# Patient Record
Sex: Female | Born: 1960 | Race: Black or African American | Hispanic: No | Marital: Single | Smoking: Former smoker
Health system: Southern US, Community
[De-identification: ages and names within clinical notes are randomized; demographics above are authoritative.]

## PROBLEM LIST (undated history)

## (undated) DIAGNOSIS — I10 Essential (primary) hypertension: Secondary | ICD-10-CM

## (undated) HISTORY — PX: BREAST CYST ASPIRATION: SHX578

## (undated) HISTORY — PX: TOE SURGERY: SHX1073

## (undated) HISTORY — PX: ABDOMINAL HYSTERECTOMY: SHX81

## (undated) HISTORY — PX: SPINE SURGERY: SHX786

## (undated) HISTORY — PX: BREAST BIOPSY: SHX20

---

## 2005-04-02 ENCOUNTER — Ambulatory Visit: Payer: Self-pay

## 2005-05-03 ENCOUNTER — Emergency Department: Payer: Self-pay | Admitting: Emergency Medicine

## 2006-07-16 ENCOUNTER — Ambulatory Visit: Payer: Self-pay

## 2007-07-18 ENCOUNTER — Emergency Department: Payer: Self-pay | Admitting: Emergency Medicine

## 2007-07-22 ENCOUNTER — Ambulatory Visit: Payer: Self-pay

## 2008-07-20 ENCOUNTER — Ambulatory Visit: Payer: Self-pay | Admitting: Family Medicine

## 2009-08-16 ENCOUNTER — Ambulatory Visit: Payer: Self-pay | Admitting: Family Medicine

## 2010-11-28 ENCOUNTER — Ambulatory Visit: Payer: Self-pay | Admitting: Family Medicine

## 2012-03-03 ENCOUNTER — Ambulatory Visit: Payer: Self-pay

## 2012-03-05 ENCOUNTER — Ambulatory Visit: Payer: Self-pay

## 2012-03-05 IMAGING — MG MM ADDITIONAL VIEWS AT NO CHARGE
2 series · 5 of 5 positions shown · non-contrast
Comparison: none

REASON FOR EXAM: AV LT SPICULATED ASYMMETRY [REDACTED]
COMMENTS:

PROCEDURE:     MAM - MAM [REDACTED] ADDED VIEW DIG LEFT  - [DATE] [DATE]
RESULT:     TECHNIQUE: Digital diagnostic left mammograms were obtained. FDA
approved computer-aided detection (CAD) for mammography was utilized for
this study.

[L ML · left · 4 of 4 slices shown]
[im 1/4]
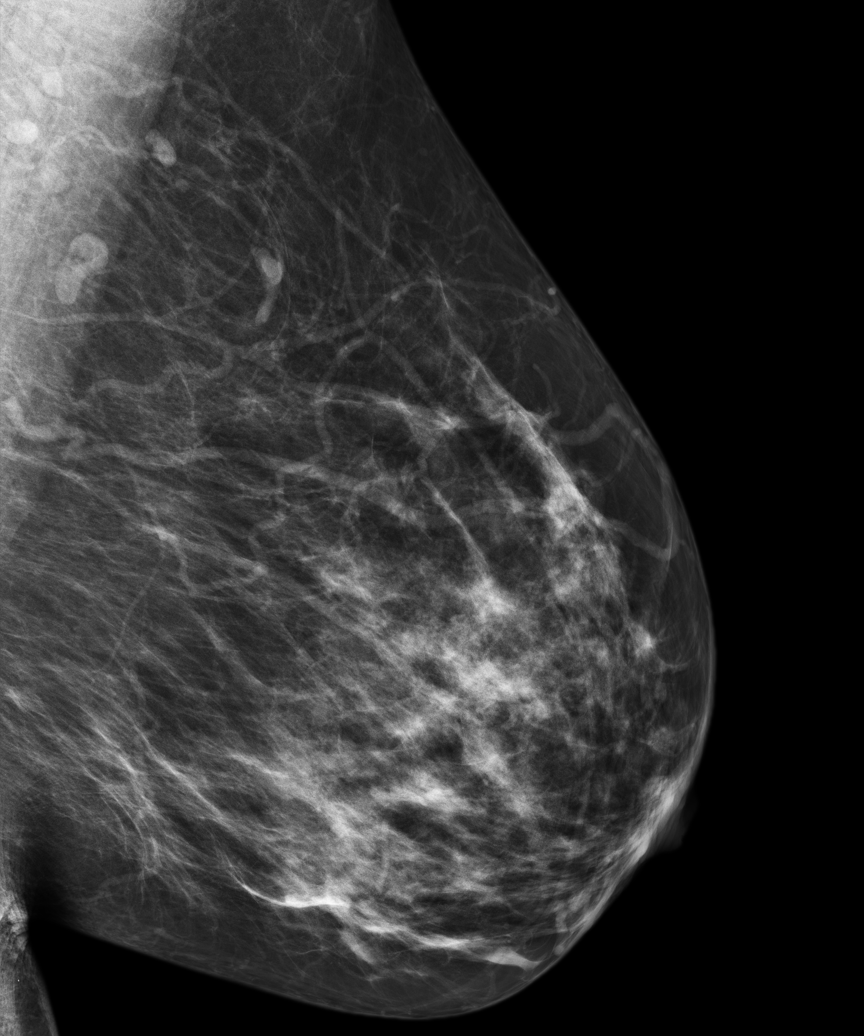
[im 2/4]
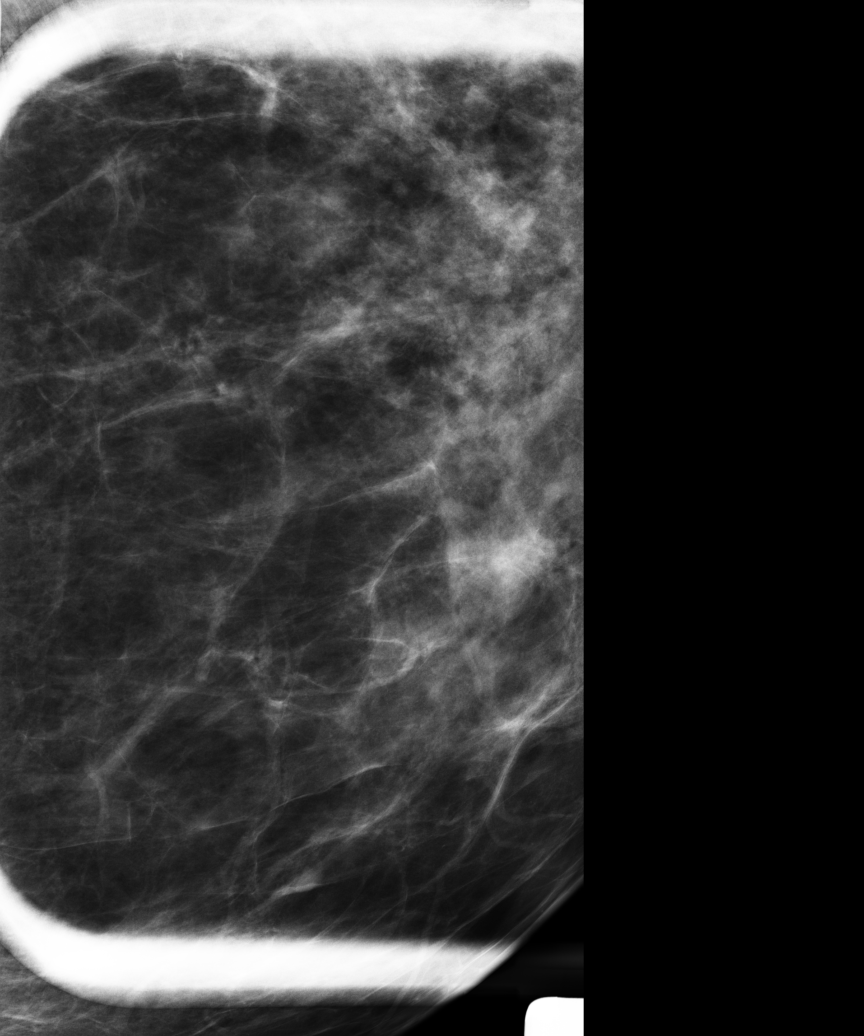
[im 3/4]
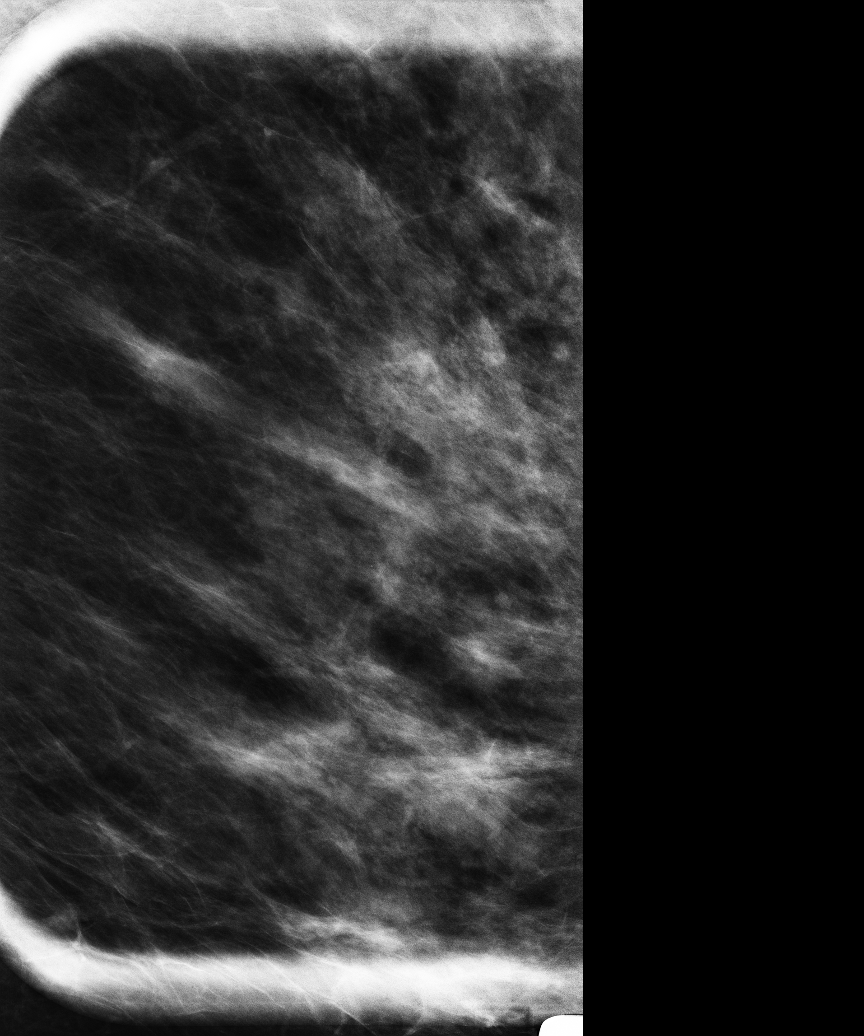
[im 4/4]
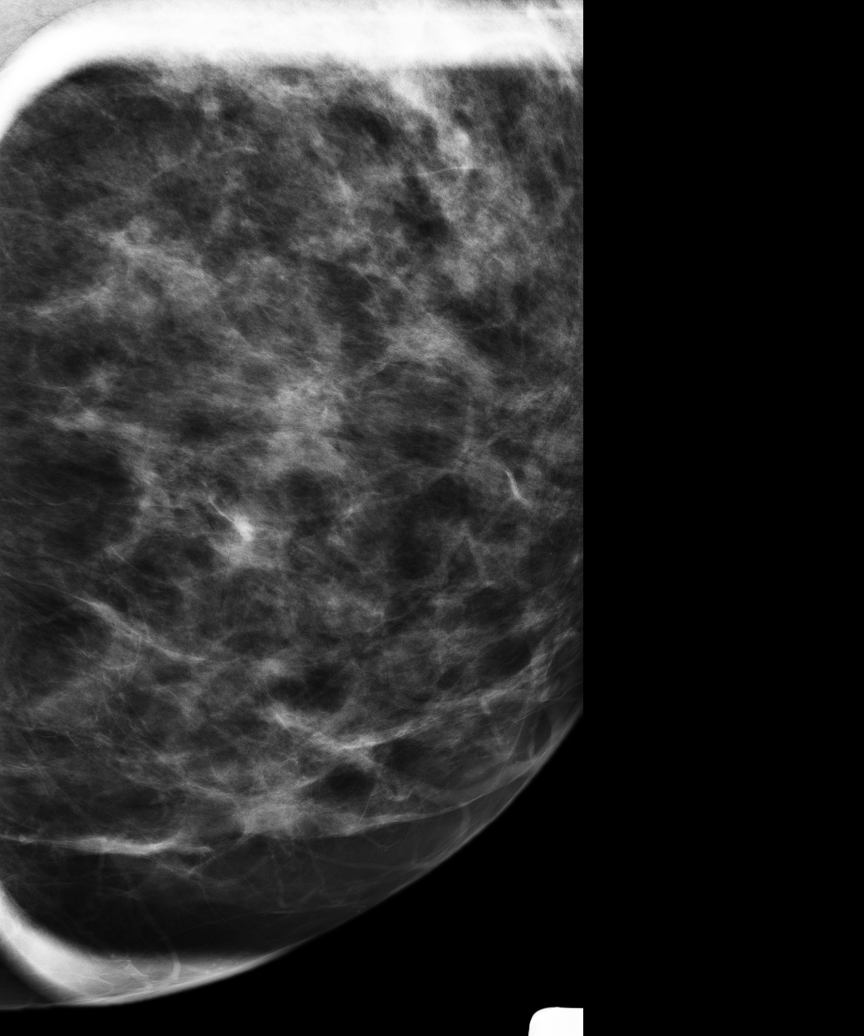

[L CC]
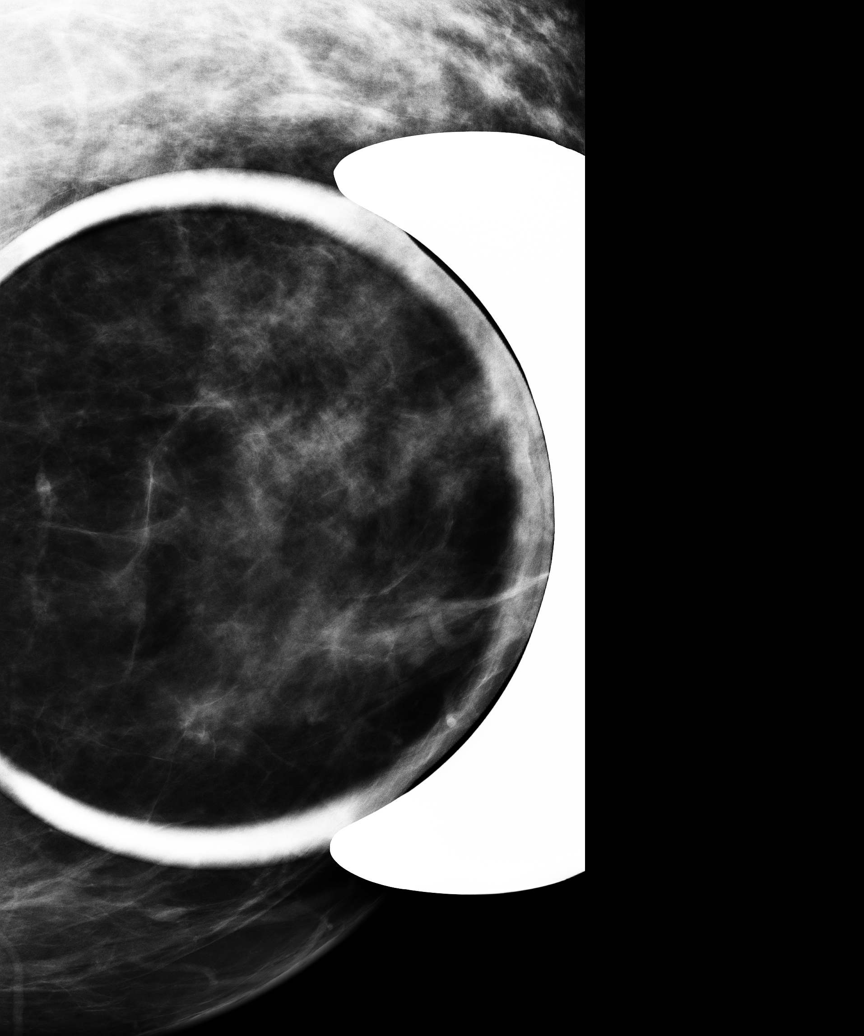

[5 of 5 positions shown; findings below may reference images not displayed]

FINDING: True lateral view and spot compression views of the left breast were
performed. The area of concern in the left medial breast compresses and
blends in with the adjacent fibroglandular tissue without a persistent mass
or architectural distortion. There are no suspicious microcalcifications.
IMPRESSION: 1.     Negative diagnostic left breast mammogram.
2.     Return to annual mammographic follow up recommended.
3.     BI-RADS:  Category 2- Benign.

A negative mammogram report does not preclude biopsy or other evaluation of
a clinically palpable or otherwise suspicious mass or lesion. Breast cancer
may not be detected by mammography in up to 10% of cases.

[REDACTED]

## 2012-11-17 ENCOUNTER — Emergency Department: Payer: Self-pay | Admitting: Emergency Medicine

## 2013-10-06 ENCOUNTER — Ambulatory Visit: Payer: Self-pay

## 2015-04-05 ENCOUNTER — Ambulatory Visit: Payer: Self-pay | Attending: Oncology

## 2015-04-05 ENCOUNTER — Ambulatory Visit
Admission: RE | Admit: 2015-04-05 | Discharge: 2015-04-05 | Disposition: A | Discharge status: Home / Self Care | Payer: Self-pay | Source: Ambulatory Visit | Attending: Oncology | Admitting: Oncology

## 2015-04-05 VITALS — BP 126/79 | HR 76 | Temp 98.1°F | Resp 18 | Ht 68.5 in | Wt 180.8 lb

## 2015-04-05 DIAGNOSIS — Z0000 Encounter for general adult medical examination without abnormal findings: Secondary | ICD-10-CM

## 2015-04-06 NOTE — Progress Notes (Signed)
Subjective:     Patient ID: Diane Wagner, female   DOB: 01-02-1961, 55 y.o.   MRN: [id number]  HPI   Review of Systems     Objective:   Physical Exam  Pulmonary/Chest: Right breast exhibits no inverted nipple, no mass, no nipple discharge, no skin change and no tenderness. Left breast exhibits no inverted nipple, no mass, no nipple discharge, no skin change and no tenderness. Breasts are symmetrical.       Assessment:     55 year old patient presents for [facility] clinic visit.  IPatient screened, and meets BCCCP eligibility.  Patient does not have insurance, Medicare or Medicaid.  Handout given on Affordable Care Act. Instructed patient on breast self-exam using teach back method.  CBE unremarkable. No mass or lump palpated.    Plan:     Sent for bilateral screening mammogram.

## 2015-04-06 NOTE — Progress Notes (Unsigned)
Letter mailed from [facility] to notify of normal mammogram results.  Patient to return in one year for annual screening.  Copy to [facility]. 

## 2017-10-06 ENCOUNTER — Other Ambulatory Visit: Payer: Self-pay | Admitting: Family Medicine

## 2017-10-06 DIAGNOSIS — Z1231 Encounter for screening mammogram for malignant neoplasm of breast: Secondary | ICD-10-CM

## 2017-10-21 ENCOUNTER — Ambulatory Visit
Admission: RE | Admit: 2017-10-21 | Discharge: 2017-10-21 | Disposition: A | Discharge status: Home / Self Care | Payer: Medicare Other | Source: Ambulatory Visit | Attending: Family Medicine | Admitting: Family Medicine

## 2017-10-21 DIAGNOSIS — Z1231 Encounter for screening mammogram for malignant neoplasm of breast: Secondary | ICD-10-CM | POA: Insufficient documentation

## 2017-10-23 ENCOUNTER — Other Ambulatory Visit: Payer: Self-pay | Admitting: Family Medicine

## 2017-10-23 DIAGNOSIS — R928 Other abnormal and inconclusive findings on diagnostic imaging of breast: Secondary | ICD-10-CM

## 2017-10-29 ENCOUNTER — Ambulatory Visit
Admission: RE | Admit: 2017-10-29 | Discharge: 2017-10-29 | Disposition: A | Discharge status: Home / Self Care | Payer: Medicare Other | Source: Ambulatory Visit | Attending: Family Medicine | Admitting: Family Medicine

## 2017-10-29 DIAGNOSIS — R928 Other abnormal and inconclusive findings on diagnostic imaging of breast: Secondary | ICD-10-CM | POA: Diagnosis not present

## 2018-11-25 ENCOUNTER — Other Ambulatory Visit: Payer: Self-pay | Admitting: Specialist

## 2018-11-25 DIAGNOSIS — Z1231 Encounter for screening mammogram for malignant neoplasm of breast: Secondary | ICD-10-CM

## 2018-12-10 ENCOUNTER — Encounter (INDEPENDENT_AMBULATORY_CARE_PROVIDER_SITE_OTHER): Payer: Self-pay

## 2018-12-10 ENCOUNTER — Ambulatory Visit
Admission: RE | Admit: 2018-12-10 | Discharge: 2018-12-10 | Disposition: A | Discharge status: Home / Self Care | Payer: Medicare Other | Source: Ambulatory Visit | Attending: Specialist | Admitting: Specialist

## 2018-12-10 ENCOUNTER — Other Ambulatory Visit: Payer: Self-pay

## 2018-12-10 DIAGNOSIS — Z1231 Encounter for screening mammogram for malignant neoplasm of breast: Secondary | ICD-10-CM | POA: Diagnosis present

## 2019-11-30 ENCOUNTER — Other Ambulatory Visit: Payer: Self-pay | Admitting: Specialist

## 2019-11-30 DIAGNOSIS — Z1231 Encounter for screening mammogram for malignant neoplasm of breast: Secondary | ICD-10-CM

## 2019-12-14 ENCOUNTER — Other Ambulatory Visit: Payer: Self-pay

## 2019-12-14 ENCOUNTER — Encounter (INDEPENDENT_AMBULATORY_CARE_PROVIDER_SITE_OTHER): Payer: Self-pay

## 2019-12-14 ENCOUNTER — Ambulatory Visit
Admission: RE | Admit: 2019-12-14 | Discharge: 2019-12-14 | Disposition: A | Discharge status: Home / Self Care | Payer: Medicare HMO | Source: Ambulatory Visit | Attending: Specialist | Admitting: Specialist

## 2019-12-14 DIAGNOSIS — Z1231 Encounter for screening mammogram for malignant neoplasm of breast: Secondary | ICD-10-CM | POA: Diagnosis present

## 2020-02-22 ENCOUNTER — Other Ambulatory Visit: Payer: Self-pay | Admitting: Physician Assistant

## 2020-02-22 DIAGNOSIS — J324 Chronic pansinusitis: Secondary | ICD-10-CM

## 2020-03-21 ENCOUNTER — Ambulatory Visit
Admission: RE | Admit: 2020-03-21 | Discharge: 2020-03-21 | Disposition: A | Discharge status: Home / Self Care | Payer: Medicare HMO | Source: Ambulatory Visit | Attending: Physician Assistant | Admitting: Physician Assistant

## 2020-03-21 ENCOUNTER — Other Ambulatory Visit: Payer: Self-pay

## 2020-03-21 DIAGNOSIS — J324 Chronic pansinusitis: Secondary | ICD-10-CM

## 2020-03-25 ENCOUNTER — Encounter: Payer: Self-pay | Admitting: Emergency Medicine

## 2020-03-25 ENCOUNTER — Other Ambulatory Visit: Payer: Self-pay

## 2020-03-25 ENCOUNTER — Ambulatory Visit
Admission: EM | Admit: 2020-03-25 | Discharge: 2020-03-25 | Disposition: A | Discharge status: Home / Self Care | Payer: Medicare HMO | Attending: Internal Medicine | Admitting: Internal Medicine

## 2020-03-25 DIAGNOSIS — U071 COVID-19: Secondary | ICD-10-CM | POA: Insufficient documentation

## 2020-03-25 DIAGNOSIS — R519 Headache, unspecified: Secondary | ICD-10-CM | POA: Diagnosis not present

## 2020-03-25 DIAGNOSIS — R0981 Nasal congestion: Secondary | ICD-10-CM | POA: Diagnosis not present

## 2020-03-25 DIAGNOSIS — J069 Acute upper respiratory infection, unspecified: Secondary | ICD-10-CM

## 2020-03-25 MED ORDER — BENZONATATE 100 MG PO CAPS: 100.0000 mg | ORAL_CAPSULE | Freq: Three times a day (TID) | ORAL | 0 refills | Status: DC

## 2020-03-25 MED ORDER — PREDNISONE 50 MG PO TABS: ORAL_TABLET | ORAL | 0 refills | Status: DC

## 2020-03-25 MED ORDER — FLUTICASONE PROPIONATE 50 MCG/ACT NA SUSP: 2.0000 | Freq: Every day | NASAL | 0 refills | Status: DC

## 2020-03-25 NOTE — ED Provider Notes (Signed)
[facility]    CSN: [id number] Arrival date & time: 03/25/20  6789      History   Chief Complaint Chief Complaint  Patient presents with  . Sinus Problem  . Cough    HPI Diane Wagner is a 60 y.o. female who presents with onset of stuffy nose, cough, HA, nose congestion and sinus pressure x 4 days ago. She denies fevers. Her nose is very stuffy. Has not done any nasal sprays or saline rinses. Has not had sinuses surgeries  History reviewed. No pertinent past medical history.  There are no problems to display for this patient.   Past Surgical History:  Procedure Laterality Date  . ABDOMINAL HYSTERECTOMY    . BREAST BIOPSY Right    negative 1985  . BREAST CYST ASPIRATION Right    neg    OB History   No obstetric history on file.    Home Medications    Prior to Admission medications   Medication Sig Start Date End Date Taking? Authorizing Provider  amLODipine (NORVASC) 10 MG tablet Take by mouth. 12/28/19 12/27/20 Yes [provider]  benazepril (LOTENSIN) 40 MG tablet Take by mouth. 12/28/19 12/27/20 Yes [provider]  benzonatate (TESSALON) 100 MG capsule Take 1 capsule (100 mg total) by mouth every 8 (eight) hours. 03/25/20  Yes Rodriguez-Southworth, Nettie Elm, PA-C  bimatoprost (LUMIGAN) 0.01 % SOLN Lumigan 0.01 % eye drops 08/17/19  Yes [provider]  estradiol (ESTRACE) 0.1 MG/GM vaginal cream estradiol 0.01% (0.1 mg/gram) vaginal cream 04/09/19  Yes [provider]  fluticasone (FLONASE) 50 MCG/ACT nasal spray Place 2 sprays into both nostrils daily. 03/25/20  Yes Rodriguez-Southworth, Nettie Elm, PA-C  hydrochlorothiazide (HYDRODIURIL) 25 MG tablet hydrochlorothiazide 25 mg tablet 12/28/19  Yes [provider]  meloxicam (MOBIC) 15 MG tablet meloxicam 15 mg tablet  TAKE 1 TABLET BY MOUTH ONCE DAILY FOR MAXIMUM OF 7 DAYS AS NEEDED 12/11/19  Yes [provider]  predniSONE (DELTASONE) 50 MG tablet One qd for  sinus inflammation 03/25/20  Yes Rodriguez-Southworth, Nettie Elm, PA-C  traMADol (ULTRAM) 50 MG tablet tramadol 50 mg tablet  Take 1 tablet every 6 hours by oral route.    [provider]    Family History Family History  Problem Relation Age of Onset  . Lung cancer Sister 50  . Breast cancer Neg Hx     Social History Social History   Tobacco Use  . Smoking status: Former Smoker    Packs/day: 0.50    Years: 40.00    Pack years: 20.00    Types: Cigarettes    Quit date: 03/19/2014    Years since quitting: 6.0  . Smokeless tobacco: Never Used  Vaping Use  . Vaping Use: Never used  Substance Use Topics  . Alcohol use: Yes    Alcohol/week: 0.0 standard drinks    Comment: Socially  . Drug use: No     Allergies   Patient has no known allergies.   Review of Systems Review of Systems  Constitutional: Positive for fatigue. Negative for appetite change, chills, diaphoresis and fever.  HENT: Positive for congestion, postnasal drip, sinus pressure and sinus pain. Negative for ear discharge, ear pain and sore throat.   Eyes: Negative for discharge.  Respiratory: Positive for cough. Negative for chest tightness and shortness of breath.   Cardiovascular: Negative for chest pain.  Gastrointestinal: Negative for diarrhea, nausea and vomiting.  Musculoskeletal: Negative for joint swelling and myalgias.  Skin: Negative for rash.  Neurological:  Positive for headaches.  Hematological: Negative for adenopathy.   Physical Exam Triage Vital Signs ED Triage Vitals  Enc Vitals Group     BP 03/25/20 0945 (!) 143/93     Pulse Rate 03/25/20 0945 99     Resp 03/25/20 0945 14     Temp 03/25/20 0945 98.3 F (36.8 C)     Temp Source 03/25/20 0945 Oral     SpO2 03/25/20 0945 100 %     Weight 03/25/20 0941 165 lb (74.8 kg)     Height 03/25/20 0941 5\' 7"  (1.702 m)     Head Circumference --      Peak Flow --      Pain Score 03/25/20 0941 7     Pain Loc --      Pain Edu? --      Excl.  in GC? --    No data found.  Updated Vital Signs BP (!) 143/93 (BP Location: Left Arm)   Pulse 99   Temp 98.3 F (36.8 C) (Oral)   Resp 14   Ht 5\' 7"  (1.702 m)   Wt 165 lb (74.8 kg)   SpO2 100%   BMI 25.84 kg/m   Visual Acuity Right Eye Distance:   Left Eye Distance:   Bilateral Distance:    Right Eye Near:   Left Eye Near:    Bilateral Near:     Physical Exam Physical Exam Vitals signs and nursing note reviewed.  Constitutional:      General: She is not in acute distress.    Appearance: Normal appearance. She is not ill-appearing, toxic-appearing or diaphoretic.  HENT:     Head: Normocephalic.     Right Ear: Tympanic membrane, ear canal and external ear normal.     Left Ear: Tympanic membrane, ear canal and external ear normal.     Nose: severe mucosa swelling noted with clear mucous    Mouth/Throat:     Mouth: Mucous membranes are moist.  Eyes:     General: No scleral icterus.       Right eye: No discharge.        Left eye: No discharge.     Conjunctiva/sclera: Conjunctivae normal.  Neck:     Musculoskeletal: Neck supple. No neck rigidity.  Cardiovascular:     Rate and Rhythm: Normal rate and regular rhythm.     Heart sounds: No murmur.  Pulmonary:     Effort: Pulmonary effort is normal.     Breath sounds: Normal breath sounds.  Abdominal:     General: Bowel sounds are normal. There is no distension.     Palpations: Abdomen is soft. There is no mass.     Tenderness: There is no abdominal tenderness. There is no guarding or rebound.     Hernia: No hernia is present.  Musculoskeletal: Normal range of motion.  Lymphadenopathy:     Cervical: No cervical adenopathy.  Skin:    General: Skin is warm and dry.     Coloration: Skin is not jaundiced.     Findings: No rash.  Neurological:     Mental Status: She is alert and oriented to person, place, and time.     Gait: Gait normal.  Psychiatric:        Mood and Affect: Mood normal.        Behavior: Behavior  normal.        Thought Content: Thought content normal.        Judgment: Judgment normal.  UC Treatments / Results  Labs (all labs ordered are listed, but only abnormal results are displayed) Labs Reviewed  SARS CORONAVIRUS 2 (TAT 6-24 HRS)    EKG   Radiology No results found.  Procedures Procedures (including critical care time)  Medications Ordered in UC Medications - No data to display  Initial Impression / Assessment and Plan / UC Course  I have reviewed the triage vital signs and the nursing notes. Viral illness with sinus HA. I showed her a video on how to do saline nose rinses and she is willing to try this. I also placed her on Prednisone and Flonase.  Covid test is pending. See instructions.    Final Clinical Impressions(s) / UC Diagnoses   Final diagnoses:  Upper respiratory tract infection, unspecified type  Nose congestion  Sinus headache     Discharge Instructions     You do not have a bacterial sinus infection, but you have a viral illness causing severe sinus inflammation and the prednisone and flonase will help with the congestion. Also try the saline rinses twice a day ones you can pass air through your nostrils, about twice a day for 5-7 days. Then as needed if you start getting congested or feel you are getting a cold  Come back if you develop a fever of 100.5 or more     ED Prescriptions    Medication Sig Dispense Auth. Provider   predniSONE (DELTASONE) 50 MG tablet One qd for sinus inflammation 5 tablet Rodriguez-Southworth, Vlada Uriostegui, PA-C   fluticasone (FLONASE) 50 MCG/ACT nasal spray Place 2 sprays into both nostrils daily. 16 g Rodriguez-Southworth, Carmisha Larusso, PA-C   benzonatate (TESSALON) 100 MG capsule Take 1 capsule (100 mg total) by mouth every 8 (eight) hours. 21 capsule Rodriguez-Southworth, Nettie Elm, PA-C     PDMP not reviewed this encounter.   Garey Ham, PA-C 03/25/20 1750

## 2020-03-25 NOTE — ED Triage Notes (Signed)
Patient c/o cough, stuffy nose, HAs, and sinus congestion and pressure that started Tuesday.  Patient denies fevers.

## 2020-03-25 NOTE — Discharge Instructions (Addendum)
You do not have a bacterial sinus infection, but you have a viral illness causing severe sinus inflammation and the prednisone and flonase will help with the congestion. Also try the saline rinses twice a day ones you can pass air through your nostrils, about twice a day for 5-7 days. Then as needed if you start getting congested or feel you are getting a cold  Come back if you develop a fever of 100.5 or more

## 2020-03-26 LAB — SARS CORONAVIRUS 2 (TAT 6-24 HRS): SARS Coronavirus 2: POSITIVE — AB

## 2020-09-14 ENCOUNTER — Other Ambulatory Visit: Payer: Self-pay

## 2020-09-14 ENCOUNTER — Encounter: Payer: Self-pay | Admitting: Emergency Medicine

## 2020-09-14 ENCOUNTER — Ambulatory Visit
Admission: EM | Admit: 2020-09-14 | Discharge: 2020-09-14 | Disposition: A | Discharge status: Home / Self Care | Payer: Medicare HMO | Attending: Sports Medicine | Admitting: Sports Medicine

## 2020-09-14 DIAGNOSIS — R059 Cough, unspecified: Secondary | ICD-10-CM | POA: Diagnosis not present

## 2020-09-14 DIAGNOSIS — Z79899 Other long term (current) drug therapy: Secondary | ICD-10-CM | POA: Insufficient documentation

## 2020-09-14 DIAGNOSIS — Z87891 Personal history of nicotine dependence: Secondary | ICD-10-CM | POA: Diagnosis not present

## 2020-09-14 DIAGNOSIS — J069 Acute upper respiratory infection, unspecified: Secondary | ICD-10-CM

## 2020-09-14 DIAGNOSIS — Z791 Long term (current) use of non-steroidal anti-inflammatories (NSAID): Secondary | ICD-10-CM | POA: Diagnosis not present

## 2020-09-14 DIAGNOSIS — R0981 Nasal congestion: Secondary | ICD-10-CM | POA: Diagnosis not present

## 2020-09-14 DIAGNOSIS — Z20822 Contact with and (suspected) exposure to covid-19: Secondary | ICD-10-CM | POA: Diagnosis not present

## 2020-09-14 DIAGNOSIS — Z801 Family history of malignant neoplasm of trachea, bronchus and lung: Secondary | ICD-10-CM | POA: Diagnosis not present

## 2020-09-14 MED ORDER — PROMETHAZINE-DM 6.25-15 MG/5ML PO SYRP: 5.0000 mL | ORAL_SOLUTION | Freq: Four times a day (QID) | ORAL | 0 refills | Status: DC | PRN

## 2020-09-14 NOTE — Discharge Instructions (Addendum)

## 2020-09-14 NOTE — ED Triage Notes (Signed)
Pt c/o cough, nasal congestion, and headache. Started about 4 days ago. Denies fever.

## 2020-09-14 NOTE — ED Provider Notes (Signed)
[facility]    CSN: [id number] Arrival date & time: 09/14/20  1348      History   Chief Complaint Chief Complaint  Patient presents with   Cough   Nasal Congestion    HPI Diane Wagner is a 60 y.o. female presenting for approximately 4-day history of fatigue, headaches, nasal congestion, dry cough.  Patient says she initially had a sore throat but that is resolved.  She denies any fevers, body aches, chest discomfort, breathing difficulty, nausea/vomiting or diarrhea.  Patient says that she has bronchitis about twice a year but has never been diagnosed with COPD.  She believes she has bronchitis again.  Patient denies any sick contacts and no known exposure to COVID-19.  Personal history of COVID-19 in January of this year.  Patient vaccinated for COVID-19 x2 and boosted in April 2022.  Patient has been using over-the-counter Flonase, decongestant/cough medication and albuterol as needed.  Patient says she does not feel sick like she did in January when she had COVID 19.  Patient recently coming off Medrol Dosepak for a back condition yesterday.  Patient says that she typically improves with azithromycin or prednisone.  No other concerns.  HPI  History reviewed. No pertinent past medical history.  There are no problems to display for this patient.   Past Surgical History:  Procedure Laterality Date   ABDOMINAL HYSTERECTOMY     BREAST BIOPSY Right    negative 1985   BREAST CYST ASPIRATION Right    neg    OB History   No obstetric history on file.      Home Medications    Prior to Admission medications   Medication Sig Start Date End Date Taking? Authorizing Provider  amLODipine (NORVASC) 10 MG tablet Take by mouth. 12/28/19 12/27/20 Yes [provider]  benazepril (LOTENSIN) 40 MG tablet Take by mouth. 12/28/19 12/27/20 Yes [provider]  bimatoprost (LUMIGAN) 0.01 % SOLN Lumigan 0.01 % eye drops 08/17/19  Yes [provider]   hydrochlorothiazide (HYDRODIURIL) 25 MG tablet hydrochlorothiazide 25 mg tablet 12/28/19  Yes [provider]  meloxicam (MOBIC) 15 MG tablet meloxicam 15 mg tablet  TAKE 1 TABLET BY MOUTH ONCE DAILY FOR MAXIMUM OF 7 DAYS AS NEEDED 12/11/19  Yes [provider]  promethazine-dextromethorphan (PROMETHAZINE-DM) 6.25-15 MG/5ML syrup Take 5 mLs by mouth 4 (four) times daily as needed for cough. 09/14/20  Yes Eusebio Friendly B, PA-C  traMADol (ULTRAM) 50 MG tablet tramadol 50 mg tablet  Take 1 tablet every 6 hours by oral route.   Yes [provider]  estradiol (ESTRACE) 0.1 MG/GM vaginal cream estradiol 0.01% (0.1 mg/gram) vaginal cream 04/09/19   [provider]  fluticasone (FLONASE) 50 MCG/ACT nasal spray Place 2 sprays into both nostrils daily. 03/25/20   Rodriguez-Southworth, Nettie Elm, PA-C    Family History Family History  Problem Relation Age of Onset   Lung cancer Sister 49   Breast cancer Neg Hx     Social History Social History   Tobacco Use   Smoking status: Former    Packs/day: 0.50    Years: 40.00    Pack years: 20.00    Types: Cigarettes    Quit date: 03/19/2014    Years since quitting: 6.4   Smokeless tobacco: Never  Vaping Use   Vaping Use: Never used  Substance Use Topics   Alcohol use: Yes    Alcohol/week: 0.0 standard drinks    Comment: Socially   Drug use: No  Allergies   Patient has no known allergies.   Review of Systems Review of Systems  Constitutional:  Positive for fatigue. Negative for chills, diaphoresis and fever.  HENT:  Positive for congestion and rhinorrhea. Negative for ear pain, sinus pressure, sinus pain and sore throat.   Respiratory:  Positive for cough. Negative for shortness of breath.   Cardiovascular:  Negative for chest pain.  Gastrointestinal:  Negative for abdominal pain, nausea and vomiting.  Musculoskeletal:  Negative for arthralgias and myalgias.  Skin:  Negative for rash.  Neurological:   Positive for headaches. Negative for weakness.  Hematological:  Negative for adenopathy.    Physical Exam Triage Vital Signs ED Triage Vitals  Enc Vitals Group     BP 09/14/20 1405 (!) 147/72     Pulse Rate 09/14/20 1405 (!) 104     Resp 09/14/20 1405 18     Temp 09/14/20 1405 98.4 F (36.9 C)     Temp Source 09/14/20 1405 Oral     SpO2 09/14/20 1405 100 %     Weight 09/14/20 1406 164 lb 14.5 oz (74.8 kg)     Height 09/14/20 1406 5\' 7"  (1.702 m)     Head Circumference --      Peak Flow --      Pain Score 09/14/20 1405 5     Pain Loc --      Pain Edu? --      Excl. in GC? --    No data found.  Updated Vital Signs BP (!) 147/72 (BP Location: Left Arm)   Pulse (!) 104   Temp 98.4 F (36.9 C) (Oral)   Resp 18   Ht 5\' 7"  (1.702 m)   Wt 164 lb 14.5 oz (74.8 kg)   SpO2 100%   BMI 25.83 kg/m       Physical Exam Vitals and nursing note reviewed.  Constitutional:      General: She is not in acute distress.    Appearance: Normal appearance. She is not ill-appearing or toxic-appearing.  HENT:     Head: Normocephalic and atraumatic.     Right Ear: Tympanic membrane, ear canal and external ear normal.     Left Ear: Tympanic membrane, ear canal and external ear normal.     Nose: Mucosal edema, congestion and rhinorrhea present.     Mouth/Throat:     Mouth: Mucous membranes are moist.     Pharynx: Oropharynx is clear. No posterior oropharyngeal erythema.  Eyes:     General: No scleral icterus.       Right eye: No discharge.        Left eye: No discharge.     Conjunctiva/sclera: Conjunctivae normal.  Cardiovascular:     Rate and Rhythm: Regular rhythm. Tachycardia present.     Heart sounds: Normal heart sounds.  Pulmonary:     Effort: Pulmonary effort is normal. No respiratory distress.     Breath sounds: Normal breath sounds. No wheezing, rhonchi or rales.  Musculoskeletal:     Cervical back: Neck supple.  Skin:    General: Skin is dry.  Neurological:     General:  No focal deficit present.     Mental Status: She is alert. Mental status is at baseline.     Motor: No weakness.     Gait: Gait normal.  Psychiatric:        Mood and Affect: Mood normal.        Behavior: Behavior normal.  Thought Content: Thought content normal.     UC Treatments / Results  Labs (all labs ordered are listed, but only abnormal results are displayed) Labs Reviewed  SARS CORONAVIRUS 2 (TAT 6-24 HRS)    EKG   Radiology No results found.  Procedures Procedures (including critical care time)  Medications Ordered in UC Medications - No data to display  Initial Impression / Assessment and Plan / UC Course  I have reviewed the triage vital signs and the nursing notes.  Pertinent labs & imaging results that were available during my care of the patient were reviewed by me and considered in my medical decision making (see chart for details).  60 year old female presenting for 4-day history of fatigue, headaches, nasal congestion and cough.  Vitals are stable.  She is afebrile and overall well-appearing.  Exam significant for congestion on exam with out rhinorrhea.  Her chest is clear to auscultation heart regular rate and rhythm.  PCR COVID test obtained.  Current CDC guidelines, isolation protocol and ED precautions reviewed with patient.  Do not see any previous diagnoses of COPD or chronic bronchitis in her chart.  She is a former smoker.  Not concerned about bronchitis or COPD or pneumonia based on her exam today.  Reviewed that with her.  Advised her this is likely viral URI and supportive care encouraged with increasing rest and fluids.  Sent in Promethazine DM and advised her to continue with her nasal spray.  She denies any shortness of breath but says she does occasionally use the inhaler.  Advised her to follow-up here if she develops a fever, worsening cough or breathing issues or if she is not better after another 7 to 10 days.  Patient agrees.   Final  Clinical Impressions(s) / UC Diagnoses   Final diagnoses:  Viral upper respiratory tract infection  Cough  Nasal congestion     Discharge Instructions      URI/COLD SYMPTOMS: Your exam today is consistent with a viral illness. Antibiotics are not indicated at this time. Use medications as directed, including cough syrup, nasal saline, and decongestants. Your symptoms should improve over the next few days and resolve within 7-10 days. Increase rest and fluids. F/u if symptoms worsen or predominate such as sore throat, ear pain, productive cough, shortness of breath, or if you develop high fevers or worsening fatigue over the next several days.    You have received COVID testing today either for positive exposure, concerning symptoms that could be related to COVID infection, screening purposes, or re-testing after confirmed positive.  Your test obtained today checks for active viral infection in the last 1-2 weeks. If your test is negative now, you can still test positive later. So, if you do develop symptoms you should either get re-tested and/or isolate x 5 days and then strict mask use x 5 days (unvaccinated) or mask use x 10 days (vaccinated). Please follow CDC guidelines.  While Rapid antigen tests come back in 15-20 minutes, send out PCR/molecular test results typically come back within 1-3 days. In the mean time, if you are symptomatic, assume this could be a positive test and treat/monitor yourself as if you do have COVID.   We will call with test results if positive. Please download the MyChart app and set up a profile to access test results.   If symptomatic, go home and rest. Push fluids. Take Tylenol as needed for discomfort. Gargle warm salt water. Throat lozenges. Take Mucinex DM or Robitussin for cough. Humidifier  in bedroom to ease coughing. Warm showers. Also review the COVID handout for more information.  COVID-19 INFECTION: The incubation period of COVID-19 is approximately 14  days after exposure, with most symptoms developing in roughly 4-5 days. Symptoms may range in severity from mild to critically severe. Roughly 80% of those infected will have mild symptoms. People of any age may become infected with COVID-19 and have the ability to transmit the virus. The most common symptoms include: fever, fatigue, cough, body aches, headaches, sore throat, nasal congestion, shortness of breath, nausea, vomiting, diarrhea, changes in smell and/or taste.    COURSE OF ILLNESS Some patients may begin with mild disease which can progress quickly into critical symptoms. If your symptoms are worsening please call ahead to the Emergency Department and proceed there for further treatment. Recovery time appears to be roughly 1-2 weeks for mild symptoms and 3-6 weeks for severe disease.   GO IMMEDIATELY TO ER FOR FEVER YOU ARE UNABLE TO GET DOWN WITH TYLENOL, BREATHING PROBLEMS, CHEST PAIN, FATIGUE, LETHARGY, INABILITY TO EAT OR DRINK, ETC  QUARANTINE AND ISOLATION: To help decrease the spread of COVID-19 please remain isolated if you have COVID infection or are highly suspected to have COVID infection. This means -stay home and isolate to one room in the home if you live with others. Do not share a bed or bathroom with others while ill, sanitize and wipe down all countertops and keep common areas clean and disinfected. Stay home for 5 days. If you have no symptoms or your symptoms are resolving after 5 days, you can leave your house. Continue to wear a mask around others for 5 additional days. If you have been in close contact (within 6 feet) of someone diagnosed with COVID 19, you are advised to quarantine in your home for 14 days as symptoms can develop anywhere from 2-14 days after exposure to the virus. If you develop symptoms, you  must isolate.  Most current guidelines for COVID after exposure -unvaccinated: isolate 5 days and strict mask use x 5 days. Test on day 5 is  possible -vaccinated: wear mask x 10 days if symptoms do not develop -You do not necessarily need to be tested for COVID if you have + exposure and  develop symptoms. Just isolate at home x10 days from symptom onset During this global pandemic, CDC advises to practice social distancing, try to stay at least 35ft away from others at all times. Wear a face covering. Wash and sanitize your hands regularly and avoid going anywhere that is not necessary.  KEEP IN MIND THAT THE COVID TEST IS NOT 100% ACCURATE AND YOU SHOULD STILL DO EVERYTHING TO PREVENT POTENTIAL SPREAD OF VIRUS TO OTHERS (WEAR MASK, WEAR GLOVES, WASH HANDS AND SANITIZE REGULARLY). IF INITIAL TEST IS NEGATIVE, THIS MAY NOT MEAN YOU ARE DEFINITELY NEGATIVE. MOST ACCURATE TESTING IS DONE 5-7 DAYS AFTER EXPOSURE.   It is not advised by CDC to get re-tested after receiving a positive COVID test since you can still test positive for weeks to months after you have already cleared the virus.   *If you have not been vaccinated for COVID, I strongly suggest you consider getting vaccinated as long as there are no contraindications.       ED Prescriptions     Medication Sig Dispense Auth. Provider   promethazine-dextromethorphan (PROMETHAZINE-DM) 6.25-15 MG/5ML syrup Take 5 mLs by mouth 4 (four) times daily as needed for cough. 118 mL Shirlee Latch, PA-C  PDMP not reviewed this encounter.   Shirlee Latch, PA-C 09/14/20 1436

## 2020-09-15 LAB — SARS CORONAVIRUS 2 (TAT 6-24 HRS): SARS Coronavirus 2: NEGATIVE

## 2020-09-20 ENCOUNTER — Other Ambulatory Visit (HOSPITAL_COMMUNITY): Payer: Self-pay | Admitting: Otolaryngology

## 2020-09-20 ENCOUNTER — Other Ambulatory Visit: Payer: Self-pay | Admitting: Otolaryngology

## 2020-09-20 DIAGNOSIS — J324 Chronic pansinusitis: Secondary | ICD-10-CM

## 2020-10-02 ENCOUNTER — Ambulatory Visit
Admission: RE | Admit: 2020-10-02 | Discharge: 2020-10-02 | Disposition: A | Discharge status: Home / Self Care | Payer: Medicare HMO | Source: Ambulatory Visit | Attending: Otolaryngology | Admitting: Otolaryngology

## 2020-10-02 ENCOUNTER — Other Ambulatory Visit: Payer: Self-pay

## 2020-10-02 DIAGNOSIS — J324 Chronic pansinusitis: Secondary | ICD-10-CM | POA: Diagnosis present

## 2020-10-09 ENCOUNTER — Other Ambulatory Visit: Payer: Self-pay | Admitting: Specialist

## 2020-10-09 DIAGNOSIS — Z1231 Encounter for screening mammogram for malignant neoplasm of breast: Secondary | ICD-10-CM

## 2020-12-14 ENCOUNTER — Other Ambulatory Visit: Payer: Self-pay

## 2020-12-14 ENCOUNTER — Ambulatory Visit
Admission: RE | Admit: 2020-12-14 | Discharge: 2020-12-14 | Disposition: A | Discharge status: Home / Self Care | Payer: Medicare HMO | Source: Ambulatory Visit | Attending: Specialist | Admitting: Specialist

## 2020-12-14 DIAGNOSIS — Z1231 Encounter for screening mammogram for malignant neoplasm of breast: Secondary | ICD-10-CM | POA: Insufficient documentation

## 2021-04-01 ENCOUNTER — Other Ambulatory Visit: Payer: Self-pay

## 2021-04-01 ENCOUNTER — Emergency Department: Payer: Medicare HMO

## 2021-04-01 ENCOUNTER — Encounter: Payer: Self-pay | Admitting: Emergency Medicine

## 2021-04-01 DIAGNOSIS — I10 Essential (primary) hypertension: Secondary | ICD-10-CM | POA: Insufficient documentation

## 2021-04-01 DIAGNOSIS — K5790 Diverticulosis of intestine, part unspecified, without perforation or abscess without bleeding: Secondary | ICD-10-CM | POA: Insufficient documentation

## 2021-04-01 DIAGNOSIS — R1010 Upper abdominal pain, unspecified: Secondary | ICD-10-CM | POA: Diagnosis present

## 2021-04-01 DIAGNOSIS — Z79899 Other long term (current) drug therapy: Secondary | ICD-10-CM | POA: Diagnosis not present

## 2021-04-01 DIAGNOSIS — R9431 Abnormal electrocardiogram [ECG] [EKG]: Secondary | ICD-10-CM | POA: Insufficient documentation

## 2021-04-01 DIAGNOSIS — N281 Cyst of kidney, acquired: Secondary | ICD-10-CM | POA: Insufficient documentation

## 2021-04-01 LAB — URINALYSIS, COMPLETE (UACMP) WITH MICROSCOPIC
Bilirubin Urine: NEGATIVE
Glucose, UA: NEGATIVE mg/dL
Hgb urine dipstick: NEGATIVE
Ketones, ur: NEGATIVE mg/dL
Leukocytes,Ua: NEGATIVE
Nitrite: NEGATIVE
Protein, ur: NEGATIVE mg/dL
Specific Gravity, Urine: 1.02 (ref 1.005–1.030)
pH: 6.5 (ref 5.0–8.0)

## 2021-04-01 LAB — CBC WITH DIFFERENTIAL/PLATELET
Abs Immature Granulocytes: 0.02 10*3/uL (ref 0.00–0.07)
Basophils Absolute: 0 10*3/uL (ref 0.0–0.1)
Basophils Relative: 1 %
Eosinophils Absolute: 0.1 10*3/uL (ref 0.0–0.5)
Eosinophils Relative: 1 %
HCT: 41.9 % (ref 36.0–46.0)
Hemoglobin: 13.8 g/dL (ref 12.0–15.0)
Immature Granulocytes: 0 %
Lymphocytes Relative: 30 %
Lymphs Abs: 1.9 10*3/uL (ref 0.7–4.0)
MCH: 26.9 pg (ref 26.0–34.0)
MCHC: 32.9 g/dL (ref 30.0–36.0)
MCV: 81.7 fL (ref 80.0–100.0)
Monocytes Absolute: 0.4 10*3/uL (ref 0.1–1.0)
Monocytes Relative: 6 %
Neutro Abs: 3.9 10*3/uL (ref 1.7–7.7)
Neutrophils Relative %: 62 %
Platelets: 259 10*3/uL (ref 150–400)
RBC: 5.13 MIL/uL — ABNORMAL HIGH (ref 3.87–5.11)
RDW: 14 % (ref 11.5–15.5)
WBC: 6.4 10*3/uL (ref 4.0–10.5)
nRBC: 0 % (ref 0.0–0.2)

## 2021-04-01 LAB — COMPREHENSIVE METABOLIC PANEL
ALT: 20 U/L (ref 0–44)
AST: 19 U/L (ref 15–41)
Albumin: 4.2 g/dL (ref 3.5–5.0)
Alkaline Phosphatase: 41 U/L (ref 38–126)
Anion gap: 8 (ref 5–15)
BUN: 13 mg/dL (ref 8–23)
CO2: 27 mmol/L (ref 22–32)
Calcium: 9.7 mg/dL (ref 8.9–10.3)
Chloride: 106 mmol/L (ref 98–111)
Creatinine, Ser: 0.74 mg/dL (ref 0.44–1.00)
GFR, Estimated: >60 mL/min (ref >60)
Glucose, Bld: 95 mg/dL (ref 70–99)
Potassium: 3.4 mmol/L — ABNORMAL LOW (ref 3.5–5.1)
Sodium: 141 mmol/L (ref 135–145)
Total Bilirubin: 0.8 mg/dL (ref 0.3–1.2)
Total Protein: 7.7 g/dL (ref 6.5–8.1)

## 2021-04-01 LAB — TROPONIN I (HIGH SENSITIVITY): Troponin I (High Sensitivity): 2 ng/L (ref <18)

## 2021-04-01 LAB — LIPASE, BLOOD: Lipase: 30 U/L (ref 11–51)

## 2021-04-01 NOTE — ED Triage Notes (Signed)
Pt c/o recurrent discomfort in upper abdomen behind the ribs and chronic joint pain. Pt denies pain when taking a deep breath.

## 2021-04-01 NOTE — ED Provider Triage Note (Signed)
Emergency Medicine Provider Triage Evaluation Note  Diane Wagner , a 61 y.o. female with a history hypertension was evaluated in triage.  Pt complains of left upper abdominal pain that started last night.  Patient states that she has had discomfort in the past but never this severe.  She denies vomiting, diarrhea or fever.  She states that pain sometimes radiates to her back.  She denies chest tightness or shortness of breath.  No, rash, falls or other mechanisms of trauma.  Review of Systems  Positive: Patient has left upper quadrant abdominal pain.  Negative: No chest pain, chest tightness or nausea.   Physical Exam  BP (!) 161/78    Pulse (!) 107    Resp 18    Ht 5\' 7"  (1.702 m)    Wt 77.1 kg    SpO2 99%    BMI 26.63 kg/m  Gen:   Awake, no distress   Resp:  Normal effort  MSK:   Moves extremities without difficulty  Other:    Medical Decision Making  Medically screening exam initiated at 9:04 PM.  Appropriate orders placed.  was informed that the remainder of the evaluation will be completed by another provider, this initial triage assessment does not replace that evaluation, and the importance of remaining in the ED until their evaluation is complete.     [facility], [place] 04/01/21 2107

## 2021-04-02 ENCOUNTER — Emergency Department: Payer: Medicare HMO

## 2021-04-02 ENCOUNTER — Emergency Department
Admission: EM | Admit: 2021-04-02 | Discharge: 2021-04-02 | Disposition: A | Discharge status: Home / Self Care | Payer: Medicare HMO | Attending: Emergency Medicine | Admitting: Emergency Medicine

## 2021-04-02 DIAGNOSIS — K5790 Diverticulosis of intestine, part unspecified, without perforation or abscess without bleeding: Secondary | ICD-10-CM | POA: Diagnosis not present

## 2021-04-02 DIAGNOSIS — R1010 Upper abdominal pain, unspecified: Secondary | ICD-10-CM

## 2021-04-02 HISTORY — DX: Essential (primary) hypertension: I10

## 2021-04-02 MED ORDER — LIDOCAINE VISCOUS HCL 2 % MT SOLN
15.0000 mL | Freq: Once | OROMUCOSAL | Status: AC
  Administered 2021-04-02: 15 mL via ORAL
  Filled 2021-04-02: qty 15

## 2021-04-02 MED ORDER — ONDANSETRON HCL 4 MG/2ML IJ SOLN
4.0000 mg | Freq: Once | INTRAMUSCULAR | Status: AC
  Administered 2021-04-02: 4 mg via INTRAVENOUS
  Filled 2021-04-02: qty 2

## 2021-04-02 MED ORDER — PANTOPRAZOLE SODIUM 40 MG PO TBEC: 40.0000 mg | DELAYED_RELEASE_TABLET | Freq: Every day | ORAL | 1 refills | Status: AC

## 2021-04-02 MED ORDER — MORPHINE SULFATE (PF) 4 MG/ML IV SOLN
4.0000 mg | Freq: Once | INTRAVENOUS | Status: AC
  Administered 2021-04-02: 4 mg via INTRAVENOUS
  Filled 2021-04-02: qty 1

## 2021-04-02 MED ORDER — ALUM & MAG HYDROXIDE-SIMETH 200-200-20 MG/5ML PO SUSP
30.0000 mL | Freq: Once | ORAL | Status: AC
  Administered 2021-04-02: 30 mL via ORAL
  Filled 2021-04-02: qty 30

## 2021-04-02 MED ORDER — IOHEXOL 300 MG/ML  SOLN
100.0000 mL | Freq: Once | INTRAMUSCULAR | Status: AC | PRN
  Administered 2021-04-02: 100 mL via INTRAVENOUS
  Filled 2021-04-02: qty 100

## 2021-04-02 NOTE — Discharge Instructions (Addendum)
Please call your gastroenterologist at [facility] for close follow-up as you may benefit from further studies such as an endoscopy.  Your labs, EKG, CT scan today showed no significant or acute abnormality or anything that would cause your upper abdominal pain/discomfort.

## 2021-04-02 NOTE — ED Provider Notes (Addendum)
[facility] Provider Note    Event Date/Time   First MD Initiated Contact with Patient 04/02/21 0010     (approximate)   History   Abdominal Pain   HPI  Diane Wagner is a 61 y.o. female with history of hypertension who presents to the emergency department with months worth of upper abdominal discomfort.  She states symptoms seem to worsen last night.  She states that she has had a previous partial hysterectomy.  She denies chest pain, shortness of breath, nausea, vomiting, diarrhea, bloody stools, melena, dysuria, hematuria, vaginal bleeding or discharge.  She has a gastroenterologist at [facility].   History provided by patient.      Past Medical History:  Diagnosis Date   Hypertension     Past Surgical History:  Procedure Laterality Date   ABDOMINAL HYSTERECTOMY     BREAST BIOPSY Right    negative 1985   BREAST CYST ASPIRATION Right    neg   TOE SURGERY      MEDICATIONS:  Prior to Admission medications   Medication Sig Start Date End Date Taking? Authorizing Provider  amLODipine (NORVASC) 10 MG tablet Take by mouth. 12/28/19 12/27/20  [provider]  benazepril (LOTENSIN) 40 MG tablet Take by mouth. 12/28/19 12/27/20  [provider]  bimatoprost (LUMIGAN) 0.01 % SOLN Lumigan 0.01 % eye drops 08/17/19   [provider]  estradiol (ESTRACE) 0.1 MG/GM vaginal cream estradiol 0.01% (0.1 mg/gram) vaginal cream 04/09/19   [provider]  fluticasone (FLONASE) 50 MCG/ACT nasal spray Place 2 sprays into both nostrils daily. 03/25/20   Rodriguez-Southworth, Sunday Spillers, PA-C  hydrochlorothiazide (HYDRODIURIL) 25 MG tablet hydrochlorothiazide 25 mg tablet 12/28/19   [provider]  meloxicam (MOBIC) 15 MG tablet meloxicam 15 mg tablet  TAKE 1 TABLET BY MOUTH ONCE DAILY FOR MAXIMUM OF 7 DAYS AS NEEDED 12/11/19   [provider]  promethazine-dextromethorphan (PROMETHAZINE-DM) 6.25-15 MG/5ML syrup Take 5 mLs by  mouth 4 (four) times daily as needed for cough. 09/14/20   Danton Clap, PA-C  traMADol (ULTRAM) 50 MG tablet tramadol 50 mg tablet  Take 1 tablet every 6 hours by oral route.    [provider]    Physical Exam   Triage Vital Signs: ED Triage Vitals  Enc Vitals Group     BP 04/01/21 2103 (!) 161/78     Pulse Rate 04/01/21 2103 (!) 107     Resp 04/01/21 2103 18     Temp 04/02/21 0049 97.9 F (36.6 C)     Temp Source 04/02/21 0049 Oral     SpO2 04/01/21 2103 99 %     Weight 04/01/21 2104 170 lb (77.1 kg)     Height 04/01/21 2104 5\' 7"  (1.702 m)     Head Circumference --      Peak Flow --      Pain Score 04/01/21 2103 5     Pain Loc --      Pain Edu? --      Excl. in [place]? --     Most recent vital signs: Vitals:   04/02/21 0049 04/02/21 0317  BP:  (!) 141/92  Pulse:  77  Resp:  16  Temp: 97.9 F (36.6 C)   SpO2:  100%    CONSTITUTIONAL: Alert and oriented and responds appropriately to questions. Well-appearing; well-nourished HEAD: Normocephalic, atraumatic EYES: Conjunctivae clear, pupils appear equal, sclera nonicteric ENT: normal nose; moist mucous membranes NECK: Supple, normal ROM CARD: Regular and  tachycardic; S1 and S2 appreciated; no murmurs, no clicks, no rubs, no gallops RESP: Normal chest excursion without splinting or tachypnea; breath sounds clear and equal bilaterally; no wheezes, no rhonchi, no rales, no hypoxia or respiratory distress, speaking full sentences ABD/GI: Normal bowel sounds; non-distended; soft, non-tender, no rebound, no guarding, no peritoneal signs BACK: The back appears normal EXT: Normal ROM in all joints; no deformity noted, no edema; no cyanosis SKIN: Normal color for age and race; warm; no rash on exposed skin NEURO: Moves all extremities equally, normal speech PSYCH: The patient's mood and manner are appropriate.   ED Results / Procedures / Treatments   LABS: (all labs ordered are listed, but only abnormal results  are displayed) Labs Reviewed  CBC WITH DIFFERENTIAL/PLATELET - Abnormal; Notable for the following components:      Result Value   RBC 5.13 (*)    All other components within normal limits  COMPREHENSIVE METABOLIC PANEL - Abnormal; Notable for the following components:   Potassium 3.4 (*)    All other components within normal limits  URINALYSIS, COMPLETE (UACMP) WITH MICROSCOPIC - Abnormal; Notable for the following components:   Color, Urine YELLOW (*)    APPearance CLEAR (*)    Bacteria, UA RARE (*)    All other components within normal limits  LIPASE, BLOOD  TROPONIN I (HIGH SENSITIVITY)     EKG:  EKG Interpretation  Date/Time:  Sunday April 01 2021 21:07:42 EST Ventricular Rate:  99 PR Interval:  170 QRS Duration: 78 QT Interval:  372 QTC Calculation: 477 R Axis:   20 Text Interpretation: Sinus rhythm with occasional Premature ventricular complexes Low voltage QRS Cannot rule out Anterior infarct , age undetermined Abnormal ECG No previous ECGs available Confirmed by Pryor Curia [phone]) on 04/02/2021 12:33:56 AM         RADIOLOGY: My personal review and interpretation of imaging: Chest x-ray clear.  CT of the abdomen pelvis shows no acute abnormality.  I have personally reviewed all radiology reports.   DG Chest 2 View  Result Date: 04/01/2021 CLINICAL DATA:  Chest pain EXAM: CHEST - 2 VIEW COMPARISON:  None. FINDINGS: The heart size and mediastinal contours are within normal limits. Both lungs are clear. The visualized skeletal structures are unremarkable. IMPRESSION: No active cardiopulmonary disease. Electronically Signed   By: Ulyses Jarred M.D.   On: 04/01/2021 21:29   CT ABDOMEN PELVIS W CONTRAST  Result Date: 04/02/2021 CLINICAL DATA:  Left upper quadrant pain for 2 days, initial encounter EXAM: CT ABDOMEN AND PELVIS WITH CONTRAST TECHNIQUE: Multidetector CT imaging of the abdomen and pelvis was performed using the standard protocol following bolus  administration of intravenous contrast. RADIATION DOSE REDUCTION: This exam was performed according to the departmental dose-optimization program which includes automated exposure control, adjustment of the mA and/or kV according to patient size and/or use of iterative reconstruction technique. CONTRAST:  180mL OMNIPAQUE IOHEXOL 300 MG/ML  SOLN COMPARISON:  None. FINDINGS: Lower chest: No acute abnormality. Hepatobiliary: No focal liver abnormality is seen. No gallstones, gallbladder wall thickening, or biliary dilatation. Pancreas: Unremarkable. No pancreatic ductal dilatation or surrounding inflammatory changes. Spleen: Normal in size without focal abnormality. Adrenals/Urinary Tract: Adrenal glands are within normal limits. Kidneys demonstrate a normal enhancement pattern bilaterally. Few scattered simple appearing cysts are noted bilaterally. The largest of these measures 2.3 cm in greatest dimension in the mid right kidney. No renal calculi or obstructive changes are noted. Normal excretion is seen on delayed images. The bladder is well  distended. Stomach/Bowel: Scattered diverticular change of the colon is noted without evidence of diverticulitis. Mild retained fecal material is noted consistent with a degree of constipation. The appendix is within normal limits. Small bowel and stomach are unremarkable. Vascular/Lymphatic: Aortic atherosclerosis. No enlarged abdominal or pelvic lymph nodes. Reproductive: Status post hysterectomy. No adnexal masses. Other: No abdominal wall hernia or abnormality. No abdominopelvic ascites. Musculoskeletal: Degenerative changes of lumbar spine are noted. No acute bony abnormality is seen. IMPRESSION: Scattered renal cysts. Diverticulosis without diverticulitis. No acute abnormality to correspond with the patient's given clinical symptomatology. Electronically Signed   By: Alcide Clever M.D.   On: 04/02/2021 01:46     PROCEDURES:  Critical Care performed: No   CRITICAL  CARE Performed by: Baxter Hire Elpidio Thielen   Total critical care time: 0 minutes  Critical care time was exclusive of separately billable procedures and treating other patients.  Critical care was necessary to treat or prevent imminent or life-threatening deterioration.  Critical care was time spent personally by me on the following activities: development of treatment plan with patient and/or surrogate as well as nursing, discussions with consultants, evaluation of patient's response to treatment, examination of patient, obtaining history from patient or surrogate, ordering and performing treatments and interventions, ordering and review of laboratory studies, ordering and review of radiographic studies, pulse oximetry and re-evaluation of patient's condition.   Procedures    IMPRESSION / MDM / ASSESSMENT AND PLAN / ED COURSE  I reviewed the triage vital signs and the nursing notes.    Patient here with complaints of upper abdominal discomfort that has been ongoing for several months.  She has not followed up with her gastroenterologist.     DIFFERENTIAL DIAGNOSIS (includes but not limited to):   Gastritis, GERD, cholelithiasis, cholecystitis, pancreatitis.  Doubt appendicitis, bowel obstruction, colitis, volvulus, perforation.  Doubt ACS, PE or dissection.   PLAN: Abdominal labs, urine, CT of the abdomen pelvis.  Will give pain medication and reassess.     MEDICATIONS GIVEN IN ED: Medications  alum & mag hydroxide-simeth (MAALOX/MYLANTA) 200-200-20 MG/5ML suspension 30 mL (30 mLs Oral Given 04/02/21 0055)    And  lidocaine (XYLOCAINE) 2 % viscous mouth solution 15 mL (15 mLs Oral Given 04/02/21 0055)  iohexol (OMNIPAQUE) 300 MG/ML solution 100 mL (100 mLs Intravenous Contrast Given 04/02/21 0129)  morphine 4 MG/ML injection 4 mg (4 mg Intravenous Given 04/02/21 0247)  ondansetron (ZOFRAN) injection 4 mg (4 mg Intravenous Given 04/02/21 0247)     ED COURSE: Patient's labs today are  reassuring.  Normal LFTs, lipase.  Troponin negative.  Chest x-ray reviewed by myself and radiologist is clear.  CT of the abdomen pelvis shows diverticulosis without diverticulitis.  No other acute abnormality noted.  Patient reports no improvement after GI cocktail.  Will give dose of morphine here.   Pain improved after morphine.  Tolerating p.o.  Recommended close follow-up with her gastroenterologist.  Will discharge on Protonix.  Recommended bland diet.  Discussed return precautions.   At this time, I do not feel there is any life-threatening condition present. I reviewed all nursing notes, vitals, pertinent previous records.  All labs, EKGs, imaging ordered have been independently reviewed and interpreted by myself.  I have reviewed nursing notes and appropriate previous records.  I feel the patient is safe to be discharged home without further emergent workup and can continue workup as an outpatient as needed. Discussed all findings and treatment plan with patient as well as usual and customary return precautions.  They verbalize understanding and are comfortable with this plan.  Outpatient follow-up has been provided as needed. All questions have been answered.   CONSULTS: No admission needed at this time given reassuring work-up and pain is well controlled.   OUTSIDE RECORDS REVIEWED: Reviewed patient's previous colonoscopy by Dr. Filbert Schilder at [facility] on 11/03/2020 which showed polyps, diverticulosis but otherwise unremarkable.  No previous endoscopy reports seen.         FINAL CLINICAL IMPRESSION(S) / ED DIAGNOSES   Final diagnoses:  Upper abdominal pain     Rx / DC Orders   ED Discharge Orders          Ordered    pantoprazole (PROTONIX) 40 MG tablet  Daily        04/02/21 0227             Note:  This document was prepared using Dragon voice recognition software and may include unintentional dictation errors.   Ryszard Socarras, Delice Bison, DO 04/02/21 0933    Morna Flud, Delice Bison,  DO 04/02/21 [phone]

## 2021-10-04 ENCOUNTER — Ambulatory Visit
Admission: EM | Admit: 2021-10-04 | Discharge: 2021-10-04 | Disposition: A | Discharge status: Home / Self Care | Payer: Medicare Other | Attending: Emergency Medicine | Admitting: Emergency Medicine

## 2021-10-04 DIAGNOSIS — Z20822 Contact with and (suspected) exposure to covid-19: Secondary | ICD-10-CM | POA: Diagnosis not present

## 2021-10-04 DIAGNOSIS — J069 Acute upper respiratory infection, unspecified: Secondary | ICD-10-CM | POA: Diagnosis not present

## 2021-10-04 LAB — SARS CORONAVIRUS 2 BY RT PCR: SARS Coronavirus 2 by RT PCR: NEGATIVE

## 2021-10-04 MED ORDER — FLUTICASONE PROPIONATE 50 MCG/ACT NA SUSP: 2.0000 | Freq: Every day | NASAL | 0 refills | Status: AC

## 2021-10-04 MED ORDER — PROMETHAZINE-DM 6.25-15 MG/5ML PO SYRP: 5.0000 mL | ORAL_SOLUTION | Freq: Four times a day (QID) | ORAL | 0 refills | Status: DC | PRN

## 2021-10-04 MED ORDER — AEROCHAMBER MV MISC: 1 refills | Status: AC

## 2021-10-04 NOTE — Discharge Instructions (Addendum)
I will contact you if and only if your COVID test comes back positive.  I will call in molnupiravir if your COVID is positive.  In the meantime, take plain Mucinex.  2 puffs from your albuterol inhaler every 4-6 hours using your spacer as needed for coughing, especially at night.  Promethazine DM for the cough.  Saline nasal irrigation with a Lloyd Huger Med rinse and distilled water as often as you want and Flonase to prevent a bacterial sinus infection.

## 2021-10-04 NOTE — ED Provider Notes (Signed)
HPI  SUBJECTIVE:  Diane Wagner is a 61 y.o. female who presents with 2 days of nasal congestion, yellow rhinorrhea, nonproductive cough, headache, sinus pain and pressure.  Symptoms started with a sore throat, but has since resolved.  She was unable to sleep at night secondary to the cough.  No fevers, body aches, facial swelling, upper dental pain, postnasal drip, wheezing, shortness of breath, nausea, vomiting, diarrhea, abdominal pain.  No known COVID exposure.  She got 4 doses of the COVID-vaccine.  No antibiotics in the past 3 months.  No antipyretic in the past 6 hours.  She tried 1 dose of Mucinex D, Robitussin, Hall's cough drops and her albuterol inhaler.  The albuterol inhaler helps.  Symptoms are worse with lying down.  She has a past medical history of asthma, COVID in January 22, hypertension and Sjoren's syndrome.  No history of environmental/seasonal allergies, diabetes.  PCP: [facility]    Past Medical History:  Diagnosis Date   Hypertension     Past Surgical History:  Procedure Laterality Date   ABDOMINAL HYSTERECTOMY     BREAST BIOPSY Right    negative 1985   BREAST CYST ASPIRATION Right    neg   TOE SURGERY      Family History  Problem Relation Age of Onset   Lung cancer Sister 36   Breast cancer Neg Hx     Social History   Tobacco Use   Smoking status: Former    Packs/day: 0.50    Years: 40.00    Total pack years: 20.00    Types: Cigarettes    Quit date: 03/19/2014    Years since quitting: 7.5   Smokeless tobacco: Never  Vaping Use   Vaping Use: Never used  Substance Use Topics   Alcohol use: Yes    Alcohol/week: 0.0 standard drinks of alcohol    Comment: Socially   Drug use: No    No current facility-administered medications for this encounter.  Current Outpatient Medications:    fluticasone (FLONASE) 50 MCG/ACT nasal spray, Place 2 sprays into both nostrils daily., Disp: 16 g, Rfl: 0   promethazine-dextromethorphan (PROMETHAZINE-DM)  6.25-15 MG/5ML syrup, Take 5 mLs by mouth 4 (four) times daily as needed for cough., Disp: 118 mL, Rfl: 0   Spacer/Aero-Holding Chambers (AEROCHAMBER MV) inhaler, Use as instructed, Disp: 1 each, Rfl: 1   amLODipine (NORVASC) 10 MG tablet, Take by mouth., Disp: , Rfl:    benazepril (LOTENSIN) 40 MG tablet, Take by mouth., Disp: , Rfl:    bimatoprost (LUMIGAN) 0.01 % SOLN, Lumigan 0.01 % eye drops, Disp: , Rfl:    estradiol (ESTRACE) 0.1 MG/GM vaginal cream, estradiol 0.01% (0.1 mg/gram) vaginal cream, Disp: , Rfl:    hydrochlorothiazide (HYDRODIURIL) 25 MG tablet, hydrochlorothiazide 25 mg tablet, Disp: , Rfl:    pantoprazole (PROTONIX) 40 MG tablet, Take 1 tablet (40 mg total) by mouth daily., Disp: 30 tablet, Rfl: 1  No Known Allergies   ROS  As noted in HPI.   Physical Exam  BP (!) 143/65 (BP Location: Left Arm)   Pulse 86   Temp 98.3 F (36.8 C) (Oral)   Resp 18   SpO2 100%   Constitutional: Well developed, well nourished, no acute distress Eyes:  EOMI, conjunctiva normal bilaterally HENT: Normocephalic, atraumatic,mucus membranes moist.  Positive clear nasal congestion.  Erythematous, swollen turbinates.  No maxillary, frontal sinus tenderness.  Tonsils surgically absent.  Positive postnasal drip. Neck: No cervical lymphadenopathy Respiratory: Normal inspiratory effort, lungs  clear bilaterally, good air movement.  No anterior, lateral chest wall tenderness Cardiovascular: Normal rate, regular rhythm, no murmurs rubs or gallops GI: nondistended skin: No rash, skin intact Musculoskeletal: no deformities Neurologic: Alert & oriented x 3, no focal neuro deficits Psychiatric: Speech and behavior appropriate   ED Course   Medications - No data to display  Orders Placed This Encounter  Procedures   SARS Coronavirus 2 by RT PCR (hospital order, performed in [facility] hospital lab) *cepheid single result test* Anterior Nasal Swab    Standing Status:   Standing    Number  of Occurrences:   1    Results for orders placed or performed during the hospital encounter of 10/04/21 (from the past 24 hour(s))  SARS Coronavirus 2 by RT PCR (hospital order, performed in [facility] hospital lab) *cepheid single result test* Anterior Nasal Swab     Status: None   Collection Time: 10/04/21 12:46 PM   Specimen: Anterior Nasal Swab  Result Value Ref Range   SARS Coronavirus 2 by RT PCR NEGATIVE NEGATIVE   No results found.  ED Clinical Impression  1. Viral upper respiratory tract infection   2. Encounter for laboratory testing for COVID-19 virus      ED Assessment/Plan  Checking COVID. Will contact pt at [phone] if positive, I will prescribe molnupiravir due to history of asthma.  COVID PCR negative.  In the meantime, Mucinex, saline nasal occasion, Flonase, 2 puffs from your albuterol inhaler using spacer every 4-6 hours as needed for coughing.  She does not need another albuterol prescription.  Promethazine DM.  She states that this helps her significantly last time.  Follow-up with PCP as needed  Discussed labs,MDM, treatment plan, and plan for follow-up with patient. Discussed sn/sx that should prompt return to the ED. patient agrees with plan.   Meds ordered this encounter  Medications   fluticasone (FLONASE) 50 MCG/ACT nasal spray    Sig: Place 2 sprays into both nostrils daily.    Dispense:  16 g    Refill:  0   promethazine-dextromethorphan (PROMETHAZINE-DM) 6.25-15 MG/5ML syrup    Sig: Take 5 mLs by mouth 4 (four) times daily as needed for cough.    Dispense:  118 mL    Refill:  0   Spacer/Aero-Holding Chambers (AEROCHAMBER MV) inhaler    Sig: Use as instructed    Dispense:  1 each    Refill:  1      *This clinic note was created using Dragon dictation software. Therefore, there may be occasional mistakes despite careful proofreading.  ?    Domenick Gong, MD 10/04/21 1408

## 2021-10-04 NOTE — ED Triage Notes (Signed)
Pt present coughing with nasal congestion and drainage. Symptom started two days ago. Pt tried otc medication with no relief.

## 2021-11-08 ENCOUNTER — Other Ambulatory Visit: Payer: Self-pay | Admitting: Orthopedic Surgery

## 2021-11-08 DIAGNOSIS — G8929 Other chronic pain: Secondary | ICD-10-CM

## 2021-11-08 DIAGNOSIS — M5136 Other intervertebral disc degeneration, lumbar region: Secondary | ICD-10-CM

## 2021-11-08 DIAGNOSIS — M4807 Spinal stenosis, lumbosacral region: Secondary | ICD-10-CM

## 2021-11-13 ENCOUNTER — Other Ambulatory Visit: Payer: Self-pay | Admitting: Specialist

## 2021-11-13 DIAGNOSIS — Z1231 Encounter for screening mammogram for malignant neoplasm of breast: Secondary | ICD-10-CM

## 2021-11-23 ENCOUNTER — Ambulatory Visit
Admission: RE | Admit: 2021-11-23 | Discharge: 2021-11-23 | Disposition: A | Discharge status: Home / Self Care | Payer: Medicare Other | Source: Ambulatory Visit | Attending: Orthopedic Surgery | Admitting: Orthopedic Surgery

## 2021-11-23 DIAGNOSIS — G8929 Other chronic pain: Secondary | ICD-10-CM

## 2021-11-23 DIAGNOSIS — M4807 Spinal stenosis, lumbosacral region: Secondary | ICD-10-CM

## 2021-11-23 DIAGNOSIS — M5136 Other intervertebral disc degeneration, lumbar region: Secondary | ICD-10-CM

## 2021-12-17 ENCOUNTER — Ambulatory Visit
Admission: EM | Admit: 2021-12-17 | Discharge: 2021-12-17 | Disposition: A | Discharge status: Home / Self Care | Payer: Medicare Other | Attending: Emergency Medicine | Admitting: Emergency Medicine

## 2021-12-17 ENCOUNTER — Ambulatory Visit
Admission: RE | Admit: 2021-12-17 | Discharge: 2021-12-17 | Disposition: A | Discharge status: Home / Self Care | Payer: Medicare Other | Source: Ambulatory Visit | Attending: Specialist | Admitting: Specialist

## 2021-12-17 DIAGNOSIS — J069 Acute upper respiratory infection, unspecified: Secondary | ICD-10-CM | POA: Diagnosis not present

## 2021-12-17 DIAGNOSIS — Z1231 Encounter for screening mammogram for malignant neoplasm of breast: Secondary | ICD-10-CM | POA: Insufficient documentation

## 2021-12-17 MED ORDER — IPRATROPIUM BROMIDE 0.06 % NA SOLN: 2.0000 | Freq: Four times a day (QID) | NASAL | 12 refills | Status: DC

## 2021-12-17 MED ORDER — BENZONATATE 100 MG PO CAPS: 200.0000 mg | ORAL_CAPSULE | Freq: Three times a day (TID) | ORAL | 0 refills | Status: DC

## 2021-12-17 MED ORDER — PROMETHAZINE-DM 6.25-15 MG/5ML PO SYRP: 5.0000 mL | ORAL_SOLUTION | Freq: Four times a day (QID) | ORAL | 0 refills | Status: DC | PRN

## 2021-12-17 NOTE — Discharge Instructions (Signed)

## 2021-12-17 NOTE — ED Triage Notes (Signed)
Pt c/o congestion, cough, pt states cough mostly dry onset Wednesday night

## 2021-12-17 NOTE — ED Provider Notes (Signed)
[facility]    CSN: [id number] Arrival date & time: 12/17/21  1158      History   Chief Complaint Chief Complaint  Patient presents with   Nasal Congestion   Cough    HPI Diane Wagner is a 61 y.o. female.   HPI  61 year old female here for evaluation of respiratory complaints.  Patient reports that she has been experiencing runny nose, nasal congestion, right ear pain, sore throat, and a nonproductive cough for the last 3 days.  She denies any fever, shortness of breath, or wheezing.  Past Medical History:  Diagnosis Date   Hypertension     There are no problems to display for this patient.   Past Surgical History:  Procedure Laterality Date   ABDOMINAL HYSTERECTOMY     BREAST BIOPSY Right    negative 1985   BREAST CYST ASPIRATION Right    neg   TOE SURGERY      OB History   No obstetric history on file.      Home Medications    Prior to Admission medications   Medication Sig Start Date End Date Taking? Authorizing Provider  benzonatate (TESSALON) 100 MG capsule Take 2 capsules (200 mg total) by mouth every 8 (eight) hours. 12/17/21  Yes Margarette Canada, NP  bimatoprost (LUMIGAN) 0.01 % SOLN Lumigan 0.01 % eye drops 08/17/19  Yes [provider]  hydrochlorothiazide (HYDRODIURIL) 25 MG tablet hydrochlorothiazide 25 mg tablet 12/28/19  Yes [provider]  ipratropium (ATROVENT) 0.06 % nasal spray Place 2 sprays into both nostrils 4 (four) times daily. 12/17/21  Yes Margarette Canada, NP  promethazine-dextromethorphan (PROMETHAZINE-DM) 6.25-15 MG/5ML syrup Take 5 mLs by mouth 4 (four) times daily as needed. 12/17/21  Yes Margarette Canada, NP  amLODipine (NORVASC) 10 MG tablet Take by mouth. 12/28/19 12/27/20  [provider]  benazepril (LOTENSIN) 40 MG tablet Take by mouth. 12/28/19 12/27/20  [provider]  estradiol (ESTRACE) 0.1 MG/GM vaginal cream estradiol 0.01% (0.1 mg/gram) vaginal cream 04/09/19   [provider]  fluticasone (FLONASE) 50 MCG/ACT nasal spray Place 2 sprays into both nostrils daily. 10/04/21   Melynda Ripple, MD  pantoprazole (PROTONIX) 40 MG tablet Take 1 tablet (40 mg total) by mouth daily. 04/02/21 04/02/22  Ward, Delice Bison, DO  Spacer/Aero-Holding Chambers (AEROCHAMBER MV) inhaler Use as instructed 10/04/21   Melynda Ripple, MD    Family History Family History  Problem Relation Age of Onset   Lung cancer Sister 42   Breast cancer Neg Hx     Social History Social History   Tobacco Use   Smoking status: Former    Packs/day: 0.50    Years: 40.00    Total pack years: 20.00    Types: Cigarettes    Quit date: 03/19/2014    Years since quitting: 7.7   Smokeless tobacco: Never  Vaping Use   Vaping Use: Never used  Substance Use Topics   Alcohol use: Yes    Alcohol/week: 0.0 standard drinks of alcohol    Comment: Socially   Drug use: No     Allergies   Patient has no known allergies.   Review of Systems Review of Systems  Constitutional:  Negative for fever.  HENT:  Positive for congestion, ear pain, rhinorrhea and sore throat.   Respiratory:  Positive for cough. Negative for shortness of breath and wheezing.      Physical Exam Triage Vital Signs ED Triage Vitals  Enc Vitals Group  BP 12/17/21 1317 (!) 143/74     Pulse Rate 12/17/21 1317 85     Resp --      Temp 12/17/21 1317 98.4 F (36.9 C)     Temp Source 12/17/21 1317 Oral     SpO2 12/17/21 1317 99 %     Weight 12/17/21 1315 170 lb (77.1 kg)     Height 12/17/21 1315 5\' 7"  (1.702 m)     Head Circumference --      Peak Flow --      Pain Score 12/17/21 1315 5     Pain Loc --      Pain Edu? --      Excl. in GC? --    No data found.  Updated Vital Signs BP (!) 143/74 (BP Location: Left Arm)   Pulse 85   Temp 98.4 F (36.9 C) (Oral)   Ht 5\' 7"  (1.702 m)   Wt 170 lb (77.1 kg)   SpO2 99%   BMI 26.63 kg/m   Visual Acuity Right Eye Distance:   Left Eye Distance:    Bilateral Distance:    Right Eye Near:   Left Eye Near:    Bilateral Near:     Physical Exam Vitals and nursing note reviewed.  Constitutional:      Appearance: Normal appearance. She is not ill-appearing.  HENT:     Head: Normocephalic and atraumatic.     Right Ear: Tympanic membrane, ear canal and external ear normal. There is no impacted cerumen.     Left Ear: Tympanic membrane, ear canal and external ear normal. There is no impacted cerumen.     Nose: Congestion and rhinorrhea present.     Mouth/Throat:     Mouth: Mucous membranes are moist.     Pharynx: Oropharynx is clear. Posterior oropharyngeal erythema present. No oropharyngeal exudate.  Cardiovascular:     Rate and Rhythm: Normal rate and regular rhythm.     Pulses: Normal pulses.     Heart sounds: Normal heart sounds. No murmur heard.    No friction rub. No gallop.  Pulmonary:     Effort: Pulmonary effort is normal.     Breath sounds: Normal breath sounds. No wheezing, rhonchi or rales.  Musculoskeletal:     Cervical back: Normal range of motion and neck supple.  Lymphadenopathy:     Cervical: No cervical adenopathy.  Skin:    General: Skin is warm and dry.     Capillary Refill: Capillary refill takes less than 2 seconds.     Findings: No erythema or rash.  Neurological:     General: No focal deficit present.     Mental Status: She is alert and oriented to person, place, and time.  Psychiatric:        Mood and Affect: Mood normal.        Behavior: Behavior normal.        Thought Content: Thought content normal.        Judgment: Judgment normal.      UC Treatments / Results  Labs (all labs ordered are listed, but only abnormal results are displayed) Labs Reviewed - No data to display  EKG   Radiology No results found.  Procedures Procedures (including critical care time)  Medications Ordered in UC Medications - No data to display  Initial Impression / Assessment and Plan / UC Course  I have  reviewed the triage vital signs and the nursing notes.  Pertinent labs & imaging results that were  available during my care of the patient were reviewed by me and considered in my medical decision making (see chart for details).   Patient is a nontoxic-appearing 61 year old female here for evaluation of 3 days worth of respiratory symptoms as outlined in the HPI above.  Patient is not in any acute distress and she is able to speak in full sentences without dyspnea or tachypnea.  Her physical exam does reveal some erythema and edema of her nasal mucosa with clear rhinorrhea to a scant degree.  There is also some mild erythema in the posterior oropharynx.  Lung sounds are clear to auscultation all fields.  Patient exam is consistent with an upper respiratory infection, most likely viral.  To be of her cough is being triggered by postnasal drip.  I will discharge her home on Atrovent nasal spray, Tessalon Perles, and Promethazine DM cough syrup to help with her symptoms.  Return precautions reviewed.   Final Clinical Impressions(s) / UC Diagnoses   Final diagnoses:  Viral URI with cough     Discharge Instructions      Use the Atrovent nasal spray, 2 squirts in each nostril every 6 hours, as needed for runny nose and postnasal drip.  Use the Tessalon Perles every 8 hours during the day.  Take them with a small sip of water.  They may give you some numbness to the base of your tongue or a metallic taste in your mouth, this is normal.  Use the Promethazine DM cough syrup at bedtime for cough and congestion.  It will make you drowsy so do not take it during the day.  Return for reevaluation or see your primary care provider for any new or worsening symptoms.      ED Prescriptions     Medication Sig Dispense Auth. Provider   benzonatate (TESSALON) 100 MG capsule Take 2 capsules (200 mg total) by mouth every 8 (eight) hours. 21 capsule Becky Augusta, NP   ipratropium (ATROVENT) 0.06 % nasal  spray Place 2 sprays into both nostrils 4 (four) times daily. 15 mL Becky Augusta, NP   promethazine-dextromethorphan (PROMETHAZINE-DM) 6.25-15 MG/5ML syrup Take 5 mLs by mouth 4 (four) times daily as needed. 118 mL Becky Augusta, NP      PDMP not reviewed this encounter.   Becky Augusta, NP 12/17/21 1342

## 2021-12-18 ENCOUNTER — Other Ambulatory Visit: Payer: Self-pay | Admitting: Specialist

## 2021-12-26 ENCOUNTER — Other Ambulatory Visit: Payer: Self-pay | Admitting: Specialist

## 2021-12-26 DIAGNOSIS — N6489 Other specified disorders of breast: Secondary | ICD-10-CM

## 2021-12-26 DIAGNOSIS — R928 Other abnormal and inconclusive findings on diagnostic imaging of breast: Secondary | ICD-10-CM

## 2022-01-17 ENCOUNTER — Ambulatory Visit
Admission: RE | Admit: 2022-01-17 | Discharge: 2022-01-17 | Disposition: A | Discharge status: Home / Self Care | Payer: Medicare Other | Source: Ambulatory Visit | Attending: Specialist | Admitting: Specialist

## 2022-01-17 DIAGNOSIS — R928 Other abnormal and inconclusive findings on diagnostic imaging of breast: Secondary | ICD-10-CM

## 2022-01-17 DIAGNOSIS — N6489 Other specified disorders of breast: Secondary | ICD-10-CM | POA: Diagnosis present

## 2022-01-22 ENCOUNTER — Ambulatory Visit
Admission: EM | Admit: 2022-01-22 | Discharge: 2022-01-22 | Disposition: A | Discharge status: Home / Self Care | Payer: Medicare Other | Attending: Family Medicine | Admitting: Family Medicine

## 2022-01-22 ENCOUNTER — Encounter: Payer: Self-pay | Admitting: Emergency Medicine

## 2022-01-22 DIAGNOSIS — J441 Chronic obstructive pulmonary disease with (acute) exacerbation: Secondary | ICD-10-CM

## 2022-01-22 DIAGNOSIS — Z87891 Personal history of nicotine dependence: Secondary | ICD-10-CM | POA: Diagnosis not present

## 2022-01-22 MED ORDER — BENZONATATE 100 MG PO CAPS: 200.0000 mg | ORAL_CAPSULE | Freq: Three times a day (TID) | ORAL | 0 refills | Status: DC

## 2022-01-22 MED ORDER — PREDNISONE 20 MG PO TABS: 40.0000 mg | ORAL_TABLET | Freq: Every day | ORAL | 0 refills | Status: AC

## 2022-01-22 MED ORDER — PROMETHAZINE-DM 6.25-15 MG/5ML PO SYRP: 5.0000 mL | ORAL_SOLUTION | Freq: Four times a day (QID) | ORAL | 0 refills | Status: DC | PRN

## 2022-01-22 MED ORDER — AZITHROMYCIN 250 MG PO TABS: 250.0000 mg | ORAL_TABLET | Freq: Once | ORAL | 0 refills | Status: AC

## 2022-01-22 NOTE — Discharge Instructions (Addendum)
Stop by the pharmacy to pick up your prescriptions.  Follow up with your primary care provider as needed.  

## 2022-01-22 NOTE — ED Triage Notes (Signed)
Pt c/o cough, sinus pain/pressure. Started about a month ago.

## 2022-01-22 NOTE — ED Provider Notes (Addendum)
[facility]    CSN: [id number] Arrival date & time: 01/22/22  1041      History   Chief Complaint Chief Complaint  Patient presents with   Cough    HPI Diane Wagner is a 61 y.o. female.   HPI   Diane Wagner presents for ongoing cough for over a month.   She has sinus pain and pressure. Had a mild headache. She took some over-the counter medications that don't help anymore. She had a scratchy throat but it went away. No fever, chills, vomiting. Yesterday, she had diarrhea but not today.   Former smoker 1/2-1 ppd for "too long" since she was 15.  She quit smoking about 7 years ago.  Says she has asthma. She has a rescue inhaler but she didn't use it.  She doesn't feel short of breath and has not been wheezing.  Says the change in weather knocked her off balance.      Past Medical History:  Diagnosis Date   Hypertension     There are no problems to display for this patient.   Past Surgical History:  Procedure Laterality Date   ABDOMINAL HYSTERECTOMY     BREAST BIOPSY Right    negative 1985   BREAST CYST ASPIRATION Right    neg   TOE SURGERY      OB History   No obstetric history on file.      Home Medications    Prior to Admission medications   Medication Sig Start Date End Date Taking? Authorizing Provider  azithromycin (ZITHROMAX Z-PAK) 250 MG tablet Take 1 tablet (250 mg total) by mouth once for 1 dose. Take 2 tablets on the first day and then 1 tablet daily 01/22/22 01/22/22 Yes Ivey Cina, DO  ipratropium (ATROVENT) 0.06 % nasal spray Place 2 sprays into both nostrils 4 (four) times daily. 12/17/21  Yes Becky Augusta, NP  pantoprazole (PROTONIX) 40 MG tablet Take 1 tablet (40 mg total) by mouth daily. 04/02/21 04/02/22 Yes Ward, Layla Maw, DO  predniSONE (DELTASONE) 20 MG tablet Take 2 tablets (40 mg total) by mouth daily for 5 days. 01/22/22 01/27/22 Yes Raylan Hanton, Seward Meth, DO  amLODipine (NORVASC) 10 MG tablet Take by mouth. 12/28/19 12/27/20   [provider]  benazepril (LOTENSIN) 40 MG tablet Take by mouth. 12/28/19 12/27/20  [provider]  benzonatate (TESSALON) 100 MG capsule Take 2 capsules (200 mg total) by mouth every 8 (eight) hours. 01/22/22   Neomia Herbel, Seward Meth, DO  bimatoprost (LUMIGAN) 0.01 % SOLN Lumigan 0.01 % eye drops 08/17/19   [provider]  estradiol (ESTRACE) 0.1 MG/GM vaginal cream estradiol 0.01% (0.1 mg/gram) vaginal cream 04/09/19   [provider]  fluticasone (FLONASE) 50 MCG/ACT nasal spray Place 2 sprays into both nostrils daily. 10/04/21   Domenick Gong, MD  hydrochlorothiazide (HYDRODIURIL) 25 MG tablet hydrochlorothiazide 25 mg tablet 12/28/19   [provider]  promethazine-dextromethorphan (PROMETHAZINE-DM) 6.25-15 MG/5ML syrup Take 5 mLs by mouth 4 (four) times daily as needed. 01/22/22   Katha Cabal, DO  Spacer/Aero-Holding Chambers (AEROCHAMBER MV) inhaler Use as instructed 10/04/21   Domenick Gong, MD    Family History Family History  Problem Relation Age of Onset   Lung cancer Sister 18   Breast cancer Neg Hx     Social History Social History   Tobacco Use   Smoking status: Former    Packs/day: 0.50    Years: 40.00    Total pack years: 20.00    Types: Cigarettes  Quit date: 03/19/2014    Years since quitting: 7.8   Smokeless tobacco: Never  Vaping Use   Vaping Use: Never used  Substance Use Topics   Alcohol use: Yes    Alcohol/week: 0.0 standard drinks of alcohol    Comment: Socially   Drug use: No     Allergies   Patient has no known allergies.   Review of Systems Review of Systems: negative unless otherwise stated in HPI.      Physical Exam Triage Vital Signs ED Triage Vitals  Enc Vitals Group     BP 01/22/22 1152 (!) 158/86     Pulse Rate 01/22/22 1152 78     Resp 01/22/22 1152 16     Temp 01/22/22 1152 97.7 F (36.5 C)     Temp Source 01/22/22 1152 Oral     SpO2 01/22/22 1152 99 %     Weight 01/22/22 1152  169 lb 15.6 oz (77.1 kg)     Height 01/22/22 1152 5\' 7"  (1.702 m)     Head Circumference --      Peak Flow --      Pain Score 01/22/22 1150 5     Pain Loc --      Pain Edu? --      Excl. in GC? --    No data found.  Updated Vital Signs BP (!) 158/86 (BP Location: Left Arm)   Pulse 78   Temp 97.7 F (36.5 C) (Oral)   Resp 16   Ht 5\' 7"  (1.702 m)   Wt 77.1 kg   SpO2 99%   BMI 26.62 kg/m   Visual Acuity Right Eye Distance:   Left Eye Distance:   Bilateral Distance:    Right Eye Near:   Left Eye Near:    Bilateral Near:     Physical Exam GEN:     alert, non-toxic appearing female in no distress     HENT:  mucus membranes moist, oropharyngeal without lesions or exudate, no tonsillar hypertrophy, mild oropharyngeal erythema , no nasal discharge EYES:   wearing glasses, no discharge, EOMi ,  no scleral injection NECK:  normal ROM RESP:  no increased work of breathing, rhonchi, no rales, no wheezing CVS:   regular rate  and rhythm Skin:   warm and dry, no rash on visible skin , normal  skin turgor    UC Treatments / Results  Labs (all labs ordered are listed, but only abnormal results are displayed) Labs Reviewed - No data to display  EKG   Radiology No results found.  Procedures Procedures (including critical care time)  Medications Ordered in UC Medications - No data to display  Initial Impression / Assessment and Plan / UC Course  I have reviewed the triage vital signs and the nursing notes.  Pertinent labs & imaging results that were available during my care of the patient were reviewed by me and considered in my medical decision making (see chart for details).       Pt is a 61 y.o. female with history of long-term tobacco use who presents for 1 month of cough that is acutely worsening.  [name] is  afebrile here without recent antipyretics. Satting well on room air. Overall pt is  non-toxic appearing, well hydrated, without respiratory distress.  Pulmonary exam is remarkable for rhonchi.  After shared decision making we will not pursue chest x-ray at this time as it currently would not change management at this time.  Next x-ray was in  January 2023 that did not show any active cardiopulmonary disease.  COVID  and influenza testing deferred due to length of symptoms.    Pt may have underlying COPD.  On chart review, it is listed in the [facility] that she has COPD and mild asthma.  Treat for COPD exacerbation with antibiotics and steroids as below.  Tessalon perles and Promethazine DM cough syrup given for cough.  Typical duration of symptoms discussed. Return and ED precautions given and patient voiced understanding.   Discussed MDM, treatment plan and plan for follow-up with patient who agrees with plan.      Final Clinical Impressions(s) / UC Diagnoses   Final diagnoses:  Former cigarette smoker  COPD exacerbation [facility])     Discharge Instructions      Stop by the pharmacy to pick up your prescriptions.  Follow up with your primary care provider as needed.        ED Prescriptions     Medication Sig Dispense Auth. Provider   promethazine-dextromethorphan (PROMETHAZINE-DM) 6.25-15 MG/5ML syrup Take 5 mLs by mouth 4 (four) times daily as needed. 118 mL Jordie Skalsky, DO   benzonatate (TESSALON) 100 MG capsule Take 2 capsules (200 mg total) by mouth every 8 (eight) hours. 21 capsule Kaulder Zahner, DO   predniSONE (DELTASONE) 20 MG tablet Take 2 tablets (40 mg total) by mouth daily for 5 days. 10 tablet Nehemiah Mcfarren, DO   azithromycin (ZITHROMAX Z-PAK) 250 MG tablet Take 1 tablet (250 mg total) by mouth once for 1 dose. Take 2 tablets on the first day and then 1 tablet daily 6 tablet Tawyna Pellot, DO      PDMP not reviewed this encounter.      Lyndee Hensen, DO 01/22/22 1249

## 2022-06-04 ENCOUNTER — Other Ambulatory Visit: Payer: Self-pay | Admitting: Specialist

## 2022-06-04 DIAGNOSIS — R928 Other abnormal and inconclusive findings on diagnostic imaging of breast: Secondary | ICD-10-CM

## 2022-07-19 ENCOUNTER — Ambulatory Visit
Admission: RE | Admit: 2022-07-19 | Discharge: 2022-07-19 | Disposition: A | Discharge status: Home / Self Care | Payer: 59 | Source: Ambulatory Visit | Attending: Specialist | Admitting: Specialist

## 2022-07-19 DIAGNOSIS — R928 Other abnormal and inconclusive findings on diagnostic imaging of breast: Secondary | ICD-10-CM | POA: Diagnosis present

## 2022-11-26 ENCOUNTER — Other Ambulatory Visit: Payer: Self-pay | Admitting: Internal Medicine

## 2022-11-26 DIAGNOSIS — Z1231 Encounter for screening mammogram for malignant neoplasm of breast: Secondary | ICD-10-CM

## 2022-12-24 ENCOUNTER — Ambulatory Visit
Admission: RE | Admit: 2022-12-24 | Discharge: 2022-12-24 | Disposition: A | Discharge status: Home / Self Care | Payer: 59 | Source: Ambulatory Visit | Attending: Internal Medicine | Admitting: Internal Medicine

## 2022-12-24 DIAGNOSIS — Z1231 Encounter for screening mammogram for malignant neoplasm of breast: Secondary | ICD-10-CM | POA: Diagnosis present

## 2023-05-19 ENCOUNTER — Other Ambulatory Visit: Payer: Self-pay | Admitting: Orthopedic Surgery

## 2023-05-19 DIAGNOSIS — M4807 Spinal stenosis, lumbosacral region: Secondary | ICD-10-CM

## 2023-05-19 DIAGNOSIS — M4316 Spondylolisthesis, lumbar region: Secondary | ICD-10-CM

## 2023-05-19 DIAGNOSIS — M5134 Other intervertebral disc degeneration, thoracic region: Secondary | ICD-10-CM

## 2023-05-19 DIAGNOSIS — M48061 Spinal stenosis, lumbar region without neurogenic claudication: Secondary | ICD-10-CM

## 2023-08-21 ENCOUNTER — Ambulatory Visit
Admission: EM | Admit: 2023-08-21 | Discharge: 2023-08-21 | Disposition: A | Discharge status: Home / Self Care | Attending: Emergency Medicine | Admitting: Emergency Medicine

## 2023-08-21 DIAGNOSIS — I10 Essential (primary) hypertension: Secondary | ICD-10-CM | POA: Diagnosis not present

## 2023-08-21 DIAGNOSIS — J069 Acute upper respiratory infection, unspecified: Secondary | ICD-10-CM | POA: Diagnosis not present

## 2023-08-21 DIAGNOSIS — Z87891 Personal history of nicotine dependence: Secondary | ICD-10-CM | POA: Diagnosis not present

## 2023-08-21 DIAGNOSIS — R0981 Nasal congestion: Secondary | ICD-10-CM | POA: Diagnosis present

## 2023-08-21 LAB — SARS CORONAVIRUS 2 BY RT PCR: SARS Coronavirus 2 by RT PCR: NEGATIVE

## 2023-08-21 MED ORDER — IPRATROPIUM BROMIDE 0.06 % NA SOLN: 2.0000 | Freq: Four times a day (QID) | NASAL | 12 refills | Status: AC

## 2023-08-21 NOTE — ED Provider Notes (Signed)
 [facility]    CSN: [id number] Arrival date & time: 08/21/23  1148      History   Chief Complaint Chief Complaint  Patient presents with   Nasal Congestion   Facial Pain    HPI KYLENA MOLE is a 63 y.o. female.   HPI  63 year old female with past medical history significant for hypertension presents for evaluation of upper respiratory symptoms that started 2 days ago and consist of nasal congestion, facial pressure, and watery eyes.  She denies any fever, nasal discharge, sore throat, ear pain, or cough.  She does not have a history of seasonal allergies.  Past Medical History:  Diagnosis Date   Hypertension     There are no active problems to display for this patient.   Past Surgical History:  Procedure Laterality Date   ABDOMINAL HYSTERECTOMY     BREAST BIOPSY Right    negative 1985   BREAST CYST ASPIRATION Right    neg   SPINE SURGERY     TOE SURGERY      OB History   No obstetric history on file.      Home Medications    Prior to Admission medications   Medication Sig Start Date End Date Taking? Authorizing Provider  amLODipine-benazepril (LOTREL) 10-40 MG capsule AMLODIPINE BESY-BENAZEPRIL HCL 10-40 MG CAPS 03/21/14  Yes [provider]  benazepril (LOTENSIN) 40 MG tablet Take by mouth. 12/28/19 08/21/23 Yes [provider]  fluticasone  (FLONASE ) 50 MCG/ACT nasal spray Place 2 sprays into both nostrils daily. 10/04/21  Yes Mortenson, Ashley, MD  hydrochlorothiazide (HYDRODIURIL) 25 MG tablet hydrochlorothiazide 25 mg tablet 12/28/19  Yes [provider]  hydroxychloroquine (PLAQUENIL) 200 MG tablet Take 200 mg by mouth. 05/03/20  Yes [provider]  amLODipine (NORVASC) 10 MG tablet Take by mouth. 12/28/19 12/27/20  [provider]  bimatoprost (LUMIGAN) 0.01 % SOLN Lumigan 0.01 % eye drops 08/17/19   [provider]  estradiol (ESTRACE) 0.1 MG/GM vaginal cream estradiol 0.01% (0.1 mg/gram)  vaginal cream 04/09/19   [provider]  ipratropium (ATROVENT ) 0.06 % nasal spray Place 2 sprays into both nostrils 4 (four) times daily. 08/21/23   Kent Pear, NP  pantoprazole  (PROTONIX ) 40 MG tablet Take 1 tablet (40 mg total) by mouth daily. 04/02/21 04/02/22  Ward, Clover Dao, DO  Spacer/Aero-Holding Chambers (AEROCHAMBER MV) inhaler Use as instructed 10/04/21   Ethlyn Herd, MD    Family History Family History  Problem Relation Age of Onset   Lung cancer Sister 41   Breast cancer Neg Hx     Social History Social History   Tobacco Use   Smoking status: Former    Current packs/day: 0.00    Average packs/day: 0.5 packs/day for 40.0 years (20.0 ttl pk-yrs)    Types: Cigarettes    Start date: 03/19/1974    Quit date: 03/19/2014    Years since quitting: 9.4   Smokeless tobacco: Never  Vaping Use   Vaping status: Never Used  Substance Use Topics   Alcohol use: Yes    Alcohol/week: 0.0 standard drinks of alcohol    Comment: Socially   Drug use: No     Allergies   Patient has no known allergies.   Review of Systems Review of Systems  Constitutional:  Negative for fever.  HENT:  Positive for congestion and sinus pressure. Negative for ear pain, postnasal drip, rhinorrhea and sore throat.   Respiratory:  Negative for cough.      Physical  Exam Triage Vital Signs ED Triage Vitals  Encounter Vitals Group     BP 08/21/23 1228 138/79     Systolic BP Percentile --      Diastolic BP Percentile --      Pulse Rate 08/21/23 1228 84     Resp 08/21/23 1228 17     Temp 08/21/23 1228 98.2 F (36.8 C)     Temp Source 08/21/23 1228 Oral     SpO2 08/21/23 1228 100 %     Weight --      Height --      Head Circumference --      Peak Flow --      Pain Score 08/21/23 1226 7     Pain Loc --      Pain Education --      Exclude from Growth Chart --    No data found.  Updated Vital Signs BP 138/79 (BP Location: Left Arm)   Pulse 84   Temp 98.2 F (36.8 C) (Oral)    Resp 17   SpO2 100%   Visual Acuity Right Eye Distance:   Left Eye Distance:   Bilateral Distance:    Right Eye Near:   Left Eye Near:    Bilateral Near:     Physical Exam Vitals and nursing note reviewed.  Constitutional:      Appearance: Normal appearance. She is not ill-appearing.  HENT:     Head: Normocephalic and atraumatic.     Right Ear: Tympanic membrane, ear canal and external ear normal. There is no impacted cerumen.     Left Ear: Tympanic membrane, ear canal and external ear normal. There is no impacted cerumen.     Nose: Congestion and rhinorrhea present.     Comments: Nasal mucosa is erythematous and markedly edematous.  I am unable to visualize her turbinates.  Dried yellow discharge in both nares.    Mouth/Throat:     Mouth: Mucous membranes are moist.     Pharynx: Oropharynx is clear. No oropharyngeal exudate or posterior oropharyngeal erythema.  Eyes:     Extraocular Movements: Extraocular movements intact.     Conjunctiva/sclera: Conjunctivae normal.     Pupils: Pupils are equal, round, and reactive to light.  Cardiovascular:     Rate and Rhythm: Normal rate and regular rhythm.     Pulses: Normal pulses.     Heart sounds: Normal heart sounds. No murmur heard.    No friction rub. No gallop.  Pulmonary:     Effort: Pulmonary effort is normal.     Breath sounds: Normal breath sounds. No wheezing, rhonchi or rales.  Musculoskeletal:     Cervical back: Normal range of motion and neck supple. No tenderness.  Lymphadenopathy:     Cervical: No cervical adenopathy.  Skin:    General: Skin is warm and dry.     Capillary Refill: Capillary refill takes less than 2 seconds.     Findings: No rash.  Neurological:     General: No focal deficit present.     Mental Status: She is alert and oriented to person, place, and time.      UC Treatments / Results  Labs (all labs ordered are listed, but only abnormal results are displayed) Labs Reviewed  SARS CORONAVIRUS  2 BY RT PCR    EKG   Radiology No results found.  Procedures Procedures (including critical care time)  Medications Ordered in UC Medications - No data to display  Initial Impression / Assessment  and Plan / UC Course  I have reviewed the triage vital signs and the nursing notes.  Pertinent labs & imaging results that were available during my care of the patient were reviewed by me and considered in my medical decision making (see chart for details).   Patient is a pleasant, nontoxic-appearing 63 year old female presenting for evaluation of 2 days worth of upper respiratory complaints as outlined HPI above.  In the exam room she does have a significant nasal tone to her voice.  Intranasal inspection reveals marked edema of her nasal mucosa.  She does have some scant dried yellow mucus in both nares but no appreciable rhinorrhea.  Oropharyngeal exam is benign.  No cervical lymphadenopathy present.  Cardiopulmonary exam reveals clear lung sounds in all fields.  Differential diagnose include COVID, influenza, viral respiratory illness.  Given that she has no lower respiratory symptoms and she has been afebrile I am less suspicious of influenza.  I will order a COVID PCR.  COVID PCR is negative.  I will discharge patient with a diagnosis of acute upper respiratory infection.  I will prescribe Atrovent  nasal spray that she can use to help with the nasal congestion.  She may also take over-the-counter antihistamine such as Claritin, Zyrtec, or Allegra to help with the congestion and drainage.  Return precautions reviewed.   Final Clinical Impressions(s) / UC Diagnoses   Final diagnoses:  Viral upper respiratory tract infection     Discharge Instructions      Your COVID test today was negative but your exam is consistent with a viral respiratory infection.  Use the Atrovent  nasal spray, 2 squirts up each nostril every 6 hours, to help with the nasal congestion and drainage.  You may  also take over-the-counter antihistamine such as Claritin, Zyrtec, or Allegra.  These will not elevate your blood pressure.  If you develop any new or worsening symptoms other return for reevaluation or follow-up with your primary care provider.   ED Prescriptions     Medication Sig Dispense Auth. Provider   ipratropium (ATROVENT ) 0.06 % nasal spray Place 2 sprays into both nostrils 4 (four) times daily. 15 mL Kent Pear, NP      PDMP not reviewed this encounter.   Kent Pear, NP 08/21/23 1328

## 2023-08-21 NOTE — ED Triage Notes (Signed)
 Sx x 2 days  Stuffy nose Facial pressure Watery eyes

## 2023-08-21 NOTE — Discharge Instructions (Addendum)
 Your COVID test today was negative but your exam is consistent with a viral respiratory infection.  Use the Atrovent  nasal spray, 2 squirts up each nostril every 6 hours, to help with the nasal congestion and drainage.  You may also take over-the-counter antihistamine such as Claritin, Zyrtec, or Allegra.  These will not elevate your blood pressure.  If you develop any new or worsening symptoms other return for reevaluation or follow-up with your primary care provider.

## 2023-10-30 ENCOUNTER — Other Ambulatory Visit: Payer: Self-pay | Admitting: Orthopedic Surgery

## 2023-10-30 DIAGNOSIS — M5412 Radiculopathy, cervical region: Secondary | ICD-10-CM

## 2023-10-30 DIAGNOSIS — M4802 Spinal stenosis, cervical region: Secondary | ICD-10-CM

## 2023-11-04 ENCOUNTER — Ambulatory Visit
Admission: RE | Admit: 2023-11-04 | Discharge: 2023-11-04 | Disposition: A | Discharge status: Home / Self Care | Source: Ambulatory Visit | Attending: Orthopedic Surgery | Admitting: Orthopedic Surgery

## 2023-11-04 DIAGNOSIS — M4802 Spinal stenosis, cervical region: Secondary | ICD-10-CM

## 2023-11-04 DIAGNOSIS — M5412 Radiculopathy, cervical region: Secondary | ICD-10-CM

## 2023-11-11 ENCOUNTER — Other Ambulatory Visit: Payer: Self-pay | Admitting: Internal Medicine

## 2023-11-11 DIAGNOSIS — Z1231 Encounter for screening mammogram for malignant neoplasm of breast: Secondary | ICD-10-CM

## 2023-11-18 ENCOUNTER — Other Ambulatory Visit: Payer: Self-pay | Admitting: Physical Medicine & Rehabilitation

## 2023-11-18 DIAGNOSIS — G8929 Other chronic pain: Secondary | ICD-10-CM

## 2023-11-18 DIAGNOSIS — M5414 Radiculopathy, thoracic region: Secondary | ICD-10-CM

## 2023-11-20 ENCOUNTER — Inpatient Hospital Stay
Admission: RE | Admit: 2023-11-20 | Discharge: 2023-11-20 | Discharge status: Home / Self Care | Source: Ambulatory Visit | Attending: Physical Medicine & Rehabilitation | Admitting: Physical Medicine & Rehabilitation

## 2023-11-20 DIAGNOSIS — M5414 Radiculopathy, thoracic region: Secondary | ICD-10-CM

## 2023-11-20 DIAGNOSIS — G8929 Other chronic pain: Secondary | ICD-10-CM

## 2023-11-24 ENCOUNTER — Other Ambulatory Visit

## 2023-12-25 ENCOUNTER — Ambulatory Visit
Admission: RE | Admit: 2023-12-25 | Discharge: 2023-12-25 | Disposition: A | Discharge status: Home / Self Care | Source: Ambulatory Visit | Attending: Internal Medicine | Admitting: Internal Medicine

## 2023-12-25 DIAGNOSIS — Z1231 Encounter for screening mammogram for malignant neoplasm of breast: Secondary | ICD-10-CM | POA: Diagnosis present
